# Patient Record
Sex: Female | Born: 1980 | Race: White | Hispanic: No | Marital: Married
Health system: Southern US, Community
[De-identification: ages and names within clinical notes are randomized; demographics above are authoritative.]

## PROBLEM LIST (undated history)

## (undated) DIAGNOSIS — O24419 Gestational diabetes mellitus in pregnancy, unspecified control: Secondary | ICD-10-CM

## (undated) DIAGNOSIS — E785 Hyperlipidemia, unspecified: Secondary | ICD-10-CM

## (undated) DIAGNOSIS — B009 Herpesviral infection, unspecified: Secondary | ICD-10-CM

## (undated) DIAGNOSIS — G43909 Migraine, unspecified, not intractable, without status migrainosus: Secondary | ICD-10-CM

## (undated) DIAGNOSIS — E079 Disorder of thyroid, unspecified: Secondary | ICD-10-CM

## (undated) HISTORY — PX: COLONOSCOPY: SHX174

## (undated) HISTORY — DX: Hyperlipidemia, unspecified: E78.5

## (undated) HISTORY — PX: TONSILLECTOMY AND ADENOIDECTOMY: SHX28

## (undated) HISTORY — PX: OTHER SURGICAL HISTORY: SHX169

## (undated) HISTORY — PX: WISDOM TOOTH EXTRACTION: SHX21

## (undated) HISTORY — DX: Migraine, unspecified, not intractable, without status migrainosus: G43.909

## (undated) HISTORY — PX: TURBINATE REDUCTION: SHX6157

## (undated) HISTORY — PX: BREAST BIOPSY: SHX20

## (undated) HISTORY — PX: TONSILLECTOMY: SUR1361

---
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
## 1998-07-15 ENCOUNTER — Other Ambulatory Visit: Admission: RE | Admit: 1998-07-15 | Discharge: 1998-07-15 | Payer: Self-pay | Admitting: Family Medicine

## 1998-07-20 ENCOUNTER — Encounter: Payer: Self-pay | Admitting: Internal Medicine

## 1998-07-20 ENCOUNTER — Ambulatory Visit (HOSPITAL_COMMUNITY): Admission: RE | Admit: 1998-07-20 | Discharge: 1998-07-20 | Payer: Self-pay | Admitting: Internal Medicine

## 1999-07-27 ENCOUNTER — Other Ambulatory Visit: Admission: RE | Admit: 1999-07-27 | Discharge: 1999-07-27 | Payer: Self-pay | Admitting: Internal Medicine

## 1999-08-09 ENCOUNTER — Encounter: Admission: RE | Admit: 1999-08-09 | Discharge: 1999-08-09 | Payer: Self-pay | Admitting: Internal Medicine

## 1999-08-09 ENCOUNTER — Encounter: Payer: Self-pay | Admitting: Internal Medicine

## 2000-04-23 ENCOUNTER — Encounter: Admission: RE | Admit: 2000-04-23 | Discharge: 2000-04-23 | Payer: Self-pay | Admitting: Internal Medicine

## 2000-04-23 ENCOUNTER — Encounter: Payer: Self-pay | Admitting: Internal Medicine

## 2000-08-14 ENCOUNTER — Other Ambulatory Visit: Admission: RE | Admit: 2000-08-14 | Discharge: 2000-08-14 | Payer: Self-pay | Admitting: Gynecology

## 2000-08-14 ENCOUNTER — Other Ambulatory Visit: Admission: RE | Admit: 2000-08-14 | Discharge: 2000-08-14 | Payer: Self-pay | Admitting: Obstetrics and Gynecology

## 2000-10-15 ENCOUNTER — Encounter: Payer: Self-pay | Admitting: Gastroenterology

## 2000-10-15 ENCOUNTER — Encounter: Admission: RE | Admit: 2000-10-15 | Discharge: 2000-10-15 | Payer: Self-pay | Admitting: Gastroenterology

## 2001-03-02 ENCOUNTER — Inpatient Hospital Stay (HOSPITAL_COMMUNITY): Admission: AD | Admit: 2001-03-02 | Discharge: 2001-03-05 | Payer: Self-pay | Admitting: Obstetrics and Gynecology

## 2001-04-15 ENCOUNTER — Encounter: Payer: Self-pay | Admitting: Family Medicine

## 2001-04-15 ENCOUNTER — Encounter: Admission: RE | Admit: 2001-04-15 | Discharge: 2001-04-15 | Payer: Self-pay | Admitting: Family Medicine

## 2001-09-16 ENCOUNTER — Other Ambulatory Visit: Admission: RE | Admit: 2001-09-16 | Discharge: 2001-09-16 | Payer: Self-pay | Admitting: Obstetrics and Gynecology

## 2002-07-28 ENCOUNTER — Encounter: Payer: Self-pay | Admitting: Family Medicine

## 2002-07-28 ENCOUNTER — Encounter: Admission: RE | Admit: 2002-07-28 | Discharge: 2002-07-28 | Payer: Self-pay | Admitting: Family Medicine

## 2002-11-17 ENCOUNTER — Other Ambulatory Visit: Admission: RE | Admit: 2002-11-17 | Discharge: 2002-11-17 | Payer: Self-pay | Admitting: Obstetrics and Gynecology

## 2003-11-24 ENCOUNTER — Other Ambulatory Visit: Admission: RE | Admit: 2003-11-24 | Discharge: 2003-11-24 | Payer: Self-pay | Admitting: Obstetrics and Gynecology

## 2003-11-30 ENCOUNTER — Encounter: Admission: RE | Admit: 2003-11-30 | Discharge: 2003-11-30 | Payer: Self-pay | Admitting: Obstetrics and Gynecology

## 2004-06-22 ENCOUNTER — Encounter: Admission: RE | Admit: 2004-06-22 | Discharge: 2004-06-22 | Payer: Self-pay | Admitting: Obstetrics and Gynecology

## 2004-07-24 ENCOUNTER — Ambulatory Visit (HOSPITAL_COMMUNITY): Admission: RE | Admit: 2004-07-24 | Discharge: 2004-07-24 | Payer: Self-pay | Admitting: General Surgery

## 2004-07-24 ENCOUNTER — Ambulatory Visit (HOSPITAL_BASED_OUTPATIENT_CLINIC_OR_DEPARTMENT_OTHER): Admission: RE | Admit: 2004-07-24 | Discharge: 2004-07-24 | Payer: Self-pay | Admitting: General Surgery

## 2004-11-28 ENCOUNTER — Other Ambulatory Visit: Admission: RE | Admit: 2004-11-28 | Discharge: 2004-11-28 | Payer: Self-pay | Admitting: Obstetrics and Gynecology

## 2005-08-23 ENCOUNTER — Emergency Department (HOSPITAL_COMMUNITY): Admission: EM | Admit: 2005-08-23 | Discharge: 2005-08-23 | Payer: Self-pay | Admitting: Emergency Medicine

## 2005-08-24 ENCOUNTER — Emergency Department (HOSPITAL_COMMUNITY): Admission: EM | Admit: 2005-08-24 | Discharge: 2005-08-24 | Payer: Self-pay | Admitting: Emergency Medicine

## 2010-01-28 ENCOUNTER — Encounter: Payer: Self-pay | Admitting: Obstetrics and Gynecology

## 2012-11-19 ENCOUNTER — Other Ambulatory Visit: Payer: Self-pay | Admitting: Obstetrics and Gynecology

## 2013-03-04 ENCOUNTER — Other Ambulatory Visit: Payer: Self-pay | Admitting: Otolaryngology

## 2014-01-20 ENCOUNTER — Other Ambulatory Visit: Payer: Self-pay | Admitting: Obstetrics and Gynecology

## 2014-01-21 LAB — CYTOLOGY - PAP

## 2014-12-28 ENCOUNTER — Other Ambulatory Visit: Payer: Self-pay | Admitting: Obstetrics and Gynecology

## 2015-01-18 LAB — OB RESULTS CONSOLE RUBELLA ANTIBODY, IGM: Rubella: NON-IMMUNE/NOT IMMUNE

## 2015-01-18 LAB — OB RESULTS CONSOLE HEPATITIS B SURFACE ANTIGEN: Hepatitis B Surface Ag: NEGATIVE

## 2015-01-18 LAB — OB RESULTS CONSOLE HIV ANTIBODY (ROUTINE TESTING): HIV: NONREACTIVE

## 2015-01-18 LAB — OB RESULTS CONSOLE RPR: RPR: NONREACTIVE

## 2015-01-18 LAB — OB RESULTS CONSOLE GC/CHLAMYDIA
Chlamydia: NEGATIVE
GC PROBE AMP, GENITAL: NEGATIVE

## 2015-01-18 LAB — OB RESULTS CONSOLE ABO/RH: RH Type: POSITIVE

## 2015-01-18 LAB — OB RESULTS CONSOLE GBS: STREP GROUP B AG: NEGATIVE

## 2015-01-18 LAB — OB RESULTS CONSOLE ANTIBODY SCREEN: Antibody Screen: NEGATIVE

## 2015-05-30 ENCOUNTER — Encounter: Payer: BLUE CROSS/BLUE SHIELD | Attending: Obstetrics and Gynecology | Admitting: Skilled Nursing Facility1

## 2015-05-30 VITALS — Ht 64.0 in | Wt 150.0 lb

## 2015-05-30 DIAGNOSIS — O99810 Abnormal glucose complicating pregnancy: Secondary | ICD-10-CM | POA: Insufficient documentation

## 2015-05-30 DIAGNOSIS — O24410 Gestational diabetes mellitus in pregnancy, diet controlled: Secondary | ICD-10-CM

## 2015-05-30 DIAGNOSIS — Z3A00 Weeks of gestation of pregnancy not specified: Secondary | ICD-10-CM | POA: Insufficient documentation

## 2015-06-01 ENCOUNTER — Encounter: Payer: Self-pay | Admitting: Skilled Nursing Facility1

## 2015-06-01 NOTE — Progress Notes (Signed)
  Patient was seen on 05/30/2015 for Gestational Diabetes self-management class at the [facility]. The following learning objectives were met by the patient during this course:   States the definition of Gestational Diabetes  States why dietary management is important in controlling blood glucose  Describes the effects each nutrient has on blood glucose levels  Demonstrates ability to create a balanced meal plan  Demonstrates carbohydrate counting   States when to check blood glucose levels  Demonstrates proper blood glucose monitoring techniques  States the effect of stress and exercise on blood glucose levels  States the importance of limiting caffeine and abstaining from alcohol and smoking  Blood glucose monitor given: One Touch Verio Flex Lot # [id number] x Exp: 03/2016 Blood glucose reading: 64  Patient instructed to monitor glucose levels: FBS: 60 - <90 1 hour: <140 2 hour: <120  *Patient received handouts:  Nutrition Diabetes and Pregnancy  Carbohydrate Counting List  Patient will be seen for follow-up as needed.

## 2015-07-15 ENCOUNTER — Other Ambulatory Visit: Payer: Self-pay | Admitting: Obstetrics

## 2015-07-18 LAB — OB RESULTS CONSOLE GBS: STREP GROUP B AG: NEGATIVE

## 2015-07-28 DIAGNOSIS — E785 Hyperlipidemia, unspecified: Secondary | ICD-10-CM | POA: Insufficient documentation

## 2015-07-28 DIAGNOSIS — K589 Irritable bowel syndrome without diarrhea: Secondary | ICD-10-CM | POA: Insufficient documentation

## 2015-08-02 ENCOUNTER — Other Ambulatory Visit: Payer: Self-pay | Admitting: Obstetrics and Gynecology

## 2015-08-03 ENCOUNTER — Encounter (HOSPITAL_COMMUNITY): Payer: Self-pay | Admitting: *Deleted

## 2015-08-05 NOTE — Patient Instructions (Signed)
[place]  08/05/2015   Your procedure is scheduled on:  08/09/2015  Enter through the Main Entrance of [facility] at [place] up the phone at the desk and dial [phone].   Call this number if you have problems the morning of surgery: [phone]   Remember:   Do not eat food:6 Hours before arrival. NPO after midnight do not eat  Do not drink clear liquids: After Midnight.  Take these medicines the morning of surgery with A SIP OF WATER: none   Do not wear jewelry, make-up or nail polish.  Do not wear lotions, powders, or perfumes. You may wear deodorant.  Do not shave 48 hours prior to surgery.  Do not bring valuables to the hospital.  [facility] is not   responsible for any belongings or valuables brought to the hospital.  Contacts, dentures or bridgework may not be worn into surgery.  Leave suitcase in the car. After surgery it may be brought to your room.  For patients admitted to the hospital, checkout time is 11:00 AM the day of              discharge.   Patients discharged the day of surgery will not be allowed to drive             home.  Name and phone number of your driver: na  Special Instructions:   N/A   Please read over the following fact sheets that you were given:   Surgical Site Infection Prevention

## 2015-08-08 ENCOUNTER — Encounter (HOSPITAL_COMMUNITY)
Admission: AD | Admit: 2015-08-08 | Discharge: 2015-08-08 | Disposition: A | Discharge status: Home / Self Care | Payer: BLUE CROSS/BLUE SHIELD | Source: Ambulatory Visit | Attending: Obstetrics and Gynecology | Admitting: Obstetrics and Gynecology

## 2015-08-08 DIAGNOSIS — O24419 Gestational diabetes mellitus in pregnancy, unspecified control: Secondary | ICD-10-CM | POA: Insufficient documentation

## 2015-08-08 DIAGNOSIS — O321XX0 Maternal care for breech presentation, not applicable or unspecified: Secondary | ICD-10-CM | POA: Insufficient documentation

## 2015-08-08 DIAGNOSIS — Z3A39 39 weeks gestation of pregnancy: Secondary | ICD-10-CM | POA: Insufficient documentation

## 2015-08-08 DIAGNOSIS — Z01812 Encounter for preprocedural laboratory examination: Secondary | ICD-10-CM | POA: Insufficient documentation

## 2015-08-08 HISTORY — DX: Gestational diabetes mellitus in pregnancy, unspecified control: O24.419

## 2015-08-08 HISTORY — DX: Herpesviral infection, unspecified: B00.9

## 2015-08-08 LAB — BASIC METABOLIC PANEL
Anion gap: 8 (ref 5–15)
BUN: 13 mg/dL (ref 6–20)
CALCIUM: 8.8 mg/dL — AB (ref 8.9–10.3)
CHLORIDE: 106 mmol/L (ref 101–111)
CO2: 21 mmol/L — AB (ref 22–32)
CREATININE: 0.53 mg/dL (ref 0.44–1.00)
GFR calc Af Amer: >60 mL/min (ref >60)
GFR calc non Af Amer: >60 mL/min (ref >60)
Glucose, Bld: 76 mg/dL (ref 65–99)
Potassium: 3.3 mmol/L — ABNORMAL LOW (ref 3.5–5.1)
Sodium: 135 mmol/L (ref 135–145)

## 2015-08-08 LAB — TYPE AND SCREEN
ABO/RH(D): A POS
ANTIBODY SCREEN: NEGATIVE

## 2015-08-08 LAB — CBC
HEMATOCRIT: 35 % — AB (ref 36.0–46.0)
HEMOGLOBIN: 12.3 g/dL (ref 12.0–15.0)
MCH: 31.1 pg (ref 26.0–34.0)
MCHC: 35.1 g/dL (ref 30.0–36.0)
MCV: 88.4 fL (ref 78.0–100.0)
Platelets: 155 10*3/uL (ref 150–400)
RBC: 3.96 MIL/uL (ref 3.87–5.11)
RDW: 13.2 % (ref 11.5–15.5)
WBC: 8.3 10*3/uL (ref 4.0–10.5)

## 2015-08-08 LAB — ABO/RH: ABO/RH(D): A POS

## 2015-08-09 ENCOUNTER — Inpatient Hospital Stay (HOSPITAL_COMMUNITY): Payer: BLUE CROSS/BLUE SHIELD | Admitting: Anesthesiology

## 2015-08-09 ENCOUNTER — Encounter (HOSPITAL_COMMUNITY): Payer: Self-pay | Admitting: Anesthesiology

## 2015-08-09 ENCOUNTER — Encounter (HOSPITAL_COMMUNITY)
Admission: AD | Disposition: A | Discharge status: Home / Self Care | Payer: Self-pay | Source: Ambulatory Visit | Attending: Obstetrics

## 2015-08-09 ENCOUNTER — Inpatient Hospital Stay (HOSPITAL_COMMUNITY)
Admission: AD | Admit: 2015-08-09 | Discharge: 2015-08-11 | DRG: 765 | Disposition: A | Discharge status: Home / Self Care | Payer: BLUE CROSS/BLUE SHIELD | Source: Ambulatory Visit | Attending: Obstetrics | Admitting: Obstetrics

## 2015-08-09 ENCOUNTER — Inpatient Hospital Stay (HOSPITAL_COMMUNITY)
Admission: AD | Admit: 2015-08-09 | Payer: BLUE CROSS/BLUE SHIELD | Source: Ambulatory Visit | Admitting: Obstetrics and Gynecology

## 2015-08-09 DIAGNOSIS — O9832 Other infections with a predominantly sexual mode of transmission complicating childbirth: Secondary | ICD-10-CM | POA: Diagnosis present

## 2015-08-09 DIAGNOSIS — O321XX0 Maternal care for breech presentation, not applicable or unspecified: Secondary | ICD-10-CM | POA: Diagnosis present

## 2015-08-09 DIAGNOSIS — Z3A39 39 weeks gestation of pregnancy: Secondary | ICD-10-CM

## 2015-08-09 DIAGNOSIS — A6000 Herpesviral infection of urogenital system, unspecified: Secondary | ICD-10-CM | POA: Diagnosis present

## 2015-08-09 DIAGNOSIS — O4202 Full-term premature rupture of membranes, onset of labor within 24 hours of rupture: Principal | ICD-10-CM | POA: Diagnosis present

## 2015-08-09 DIAGNOSIS — O24420 Gestational diabetes mellitus in childbirth, diet controlled: Secondary | ICD-10-CM | POA: Diagnosis present

## 2015-08-09 LAB — GLUCOSE, CAPILLARY
Glucose-Capillary: 85 mg/dL (ref 65–99)
Glucose-Capillary: 88 mg/dL (ref 65–99)

## 2015-08-09 LAB — CBC
HCT: 33.3 % — ABNORMAL LOW (ref 36.0–46.0)
Hemoglobin: 11.6 g/dL — ABNORMAL LOW (ref 12.0–15.0)
MCH: 30.5 pg (ref 26.0–34.0)
MCHC: 34.8 g/dL (ref 30.0–36.0)
MCV: 87.6 fL (ref 78.0–100.0)
PLATELETS: 146 10*3/uL — AB (ref 150–400)
RBC: 3.8 MIL/uL — AB (ref 3.87–5.11)
RDW: 13.3 % (ref 11.5–15.5)
WBC: 15.1 10*3/uL — AB (ref 4.0–10.5)

## 2015-08-09 LAB — RPR: RPR: NONREACTIVE

## 2015-08-09 SURGERY — Surgical Case
Anesthesia: Spinal

## 2015-08-09 MED ORDER — LACTATED RINGERS IV SOLN
INTRAVENOUS | Status: DC | PRN
  Administered 2015-08-09: 03:00:00 via INTRAVENOUS

## 2015-08-09 MED ORDER — LACTATED RINGERS IV SOLN
INTRAVENOUS | Status: DC
  Administered 2015-08-09: 02:00:00 via INTRAVENOUS
  Administered 2015-08-09: 125 mL/h via INTRAVENOUS

## 2015-08-09 MED ORDER — DIPHENHYDRAMINE HCL 25 MG PO CAPS: 25.0000 mg | ORAL_CAPSULE | ORAL | Status: DC | PRN

## 2015-08-09 MED ORDER — ACETAMINOPHEN 500 MG PO TABS
1000.0000 mg | ORAL_TABLET | Freq: Four times a day (QID) | ORAL | Status: AC
  Administered 2015-08-09: 1000 mg via ORAL
  Filled 2015-08-09 (×3): qty 2

## 2015-08-09 MED ORDER — MEPERIDINE HCL 25 MG/ML IJ SOLN
6.2500 mg | INTRAMUSCULAR | Status: DC | PRN
  Administered 2015-08-09: 6.25 mg via INTRAVENOUS

## 2015-08-09 MED ORDER — DIBUCAINE 1 % RE OINT: 1.0000 "application " | TOPICAL_OINTMENT | RECTAL | Status: DC | PRN

## 2015-08-09 MED ORDER — FAMOTIDINE IN NACL 20-0.9 MG/50ML-% IV SOLN
20.0000 mg | Freq: Once | INTRAVENOUS | Status: AC
  Administered 2015-08-09: 20 mg via INTRAVENOUS

## 2015-08-09 MED ORDER — IBUPROFEN 600 MG PO TABS
600.0000 mg | ORAL_TABLET | Freq: Four times a day (QID) | ORAL | Status: DC
  Administered 2015-08-10 – 2015-08-11 (×7): 600 mg via ORAL
  Filled 2015-08-09 (×9): qty 1

## 2015-08-09 MED ORDER — ONDANSETRON HCL 4 MG/2ML IJ SOLN
4.0000 mg | Freq: Three times a day (TID) | INTRAMUSCULAR | Status: DC | PRN
  Administered 2015-08-09: 4 mg via INTRAVENOUS
  Filled 2015-08-09: qty 2

## 2015-08-09 MED ORDER — OXYTOCIN 40 UNITS IN LACTATED RINGERS INFUSION - SIMPLE MED: 2.5000 [IU]/h | INTRAVENOUS | Status: AC

## 2015-08-09 MED ORDER — COCONUT OIL OIL: 1.0000 "application " | TOPICAL_OIL | Status: DC | PRN

## 2015-08-09 MED ORDER — FENTANYL CITRATE (PF) 100 MCG/2ML IJ SOLN
INTRAMUSCULAR | Status: AC
  Filled 2015-08-09: qty 2

## 2015-08-09 MED ORDER — SCOPOLAMINE 1 MG/3DAYS TD PT72
1.0000 | MEDICATED_PATCH | Freq: Once | TRANSDERMAL | Status: DC
  Administered 2015-08-09: 1.5 mg via TRANSDERMAL
  Filled 2015-08-09 (×2): qty 1

## 2015-08-09 MED ORDER — PHENYLEPHRINE 8 MG IN D5W 100 ML (0.08MG/ML) PREMIX OPTIME
INJECTION | INTRAVENOUS | Status: AC
  Filled 2015-08-09: qty 100

## 2015-08-09 MED ORDER — LACTATED RINGERS IV SOLN
INTRAVENOUS | Status: DC
  Administered 2015-08-09: 15:00:00 via INTRAVENOUS

## 2015-08-09 MED ORDER — FENTANYL CITRATE (PF) 100 MCG/2ML IJ SOLN
INTRAMUSCULAR | Status: DC | PRN
  Administered 2015-08-09: 20 ug via INTRAVENOUS

## 2015-08-09 MED ORDER — MEASLES, MUMPS & RUBELLA VAC ~~LOC~~ INJ
0.5000 mL | INJECTION | Freq: Once | SUBCUTANEOUS | Status: AC
  Administered 2015-08-11: 0.5 mL via SUBCUTANEOUS
  Filled 2015-08-09 (×2): qty 0.5

## 2015-08-09 MED ORDER — SOD CITRATE-CITRIC ACID 500-334 MG/5ML PO SOLN
30.0000 mL | Freq: Once | ORAL | Status: AC
  Administered 2015-08-09: 30 mL via ORAL

## 2015-08-09 MED ORDER — DEXAMETHASONE SODIUM PHOSPHATE 10 MG/ML IJ SOLN
INTRAMUSCULAR | Status: AC
Start: 2015-08-09 — End: 2015-08-09
  Filled 2015-08-09: qty 1

## 2015-08-09 MED ORDER — SIMETHICONE 80 MG PO CHEW
80.0000 mg | CHEWABLE_TABLET | ORAL | Status: DC
  Administered 2015-08-10 (×2): 80 mg via ORAL
  Filled 2015-08-09 (×2): qty 1

## 2015-08-09 MED ORDER — PHENYLEPHRINE 8 MG IN D5W 100 ML (0.08MG/ML) PREMIX OPTIME
INJECTION | INTRAVENOUS | Status: DC | PRN
  Administered 2015-08-09: 60 ug/min via INTRAVENOUS

## 2015-08-09 MED ORDER — FENTANYL CITRATE (PF) 100 MCG/2ML IJ SOLN
25.0000 ug | INTRAMUSCULAR | Status: DC | PRN
  Administered 2015-08-09: 50 ug via INTRAVENOUS

## 2015-08-09 MED ORDER — DEXAMETHASONE SODIUM PHOSPHATE 10 MG/ML IJ SOLN
INTRAMUSCULAR | Status: DC | PRN
  Administered 2015-08-09: 10 mg via INTRAVENOUS

## 2015-08-09 MED ORDER — BUPIVACAINE IN DEXTROSE 0.75-8.25 % IT SOLN
INTRATHECAL | Status: DC | PRN
  Administered 2015-08-09: 1.6 mL via INTRATHECAL

## 2015-08-09 MED ORDER — TETANUS-DIPHTH-ACELL PERTUSSIS 5-2.5-18.5 LF-MCG/0.5 IM SUSP
0.5000 mL | Freq: Once | INTRAMUSCULAR | Status: AC
  Administered 2015-08-11: 0.5 mL via INTRAMUSCULAR
  Filled 2015-08-09: qty 0.5

## 2015-08-09 MED ORDER — FAMOTIDINE IN NACL 20-0.9 MG/50ML-% IV SOLN
INTRAVENOUS | Status: AC
  Filled 2015-08-09: qty 50

## 2015-08-09 MED ORDER — SCOPOLAMINE 1 MG/3DAYS TD PT72
MEDICATED_PATCH | TRANSDERMAL | Status: AC
  Filled 2015-08-09: qty 1

## 2015-08-09 MED ORDER — NALOXONE HCL 0.4 MG/ML IJ SOLN: 0.4000 mg | INTRAMUSCULAR | Status: DC | PRN

## 2015-08-09 MED ORDER — MENTHOL 3 MG MT LOZG: 1.0000 | LOZENGE | OROMUCOSAL | Status: DC | PRN

## 2015-08-09 MED ORDER — SODIUM CHLORIDE 0.9% FLUSH: 3.0000 mL | INTRAVENOUS | Status: DC | PRN

## 2015-08-09 MED ORDER — MORPHINE SULFATE (PF) 0.5 MG/ML IJ SOLN
INTRAMUSCULAR | Status: DC | PRN
  Administered 2015-08-09: .2 mg via EPIDURAL

## 2015-08-09 MED ORDER — KETOROLAC TROMETHAMINE 30 MG/ML IJ SOLN
30.0000 mg | Freq: Four times a day (QID) | INTRAMUSCULAR | Status: AC | PRN
  Administered 2015-08-09: 30 mg via INTRAMUSCULAR
  Filled 2015-08-09: qty 1

## 2015-08-09 MED ORDER — SIMETHICONE 80 MG PO CHEW: 80.0000 mg | CHEWABLE_TABLET | ORAL | Status: DC | PRN

## 2015-08-09 MED ORDER — PROMETHAZINE HCL 25 MG/ML IJ SOLN: 6.2500 mg | INTRAMUSCULAR | Status: DC | PRN

## 2015-08-09 MED ORDER — SOD CITRATE-CITRIC ACID 500-334 MG/5ML PO SOLN
ORAL | Status: AC
  Filled 2015-08-09: qty 15

## 2015-08-09 MED ORDER — SENNOSIDES-DOCUSATE SODIUM 8.6-50 MG PO TABS
2.0000 | ORAL_TABLET | ORAL | Status: DC
  Administered 2015-08-10 (×2): 2 via ORAL
  Filled 2015-08-09 (×2): qty 2

## 2015-08-09 MED ORDER — OXYTOCIN 10 UNIT/ML IJ SOLN
INTRAVENOUS | Status: DC | PRN
  Administered 2015-08-09: 40 [IU] via INTRAVENOUS

## 2015-08-09 MED ORDER — MORPHINE SULFATE-NACL 0.5-0.9 MG/ML-% IV SOSY
PREFILLED_SYRINGE | INTRAVENOUS | Status: AC
  Filled 2015-08-09: qty 1

## 2015-08-09 MED ORDER — KETOROLAC TROMETHAMINE 30 MG/ML IJ SOLN: 30.0000 mg | Freq: Four times a day (QID) | INTRAMUSCULAR | Status: AC | PRN

## 2015-08-09 MED ORDER — MEPERIDINE HCL 25 MG/ML IJ SOLN
INTRAMUSCULAR | Status: AC
  Filled 2015-08-09: qty 1

## 2015-08-09 MED ORDER — ACETAMINOPHEN 325 MG PO TABS
650.0000 mg | ORAL_TABLET | ORAL | Status: DC | PRN
  Administered 2015-08-10 (×2): 650 mg via ORAL
  Filled 2015-08-09 (×2): qty 2

## 2015-08-09 MED ORDER — ONDANSETRON HCL 4 MG/2ML IJ SOLN
INTRAMUSCULAR | Status: DC | PRN
  Administered 2015-08-09: 4 mg via INTRAVENOUS

## 2015-08-09 MED ORDER — WITCH HAZEL-GLYCERIN EX PADS: 1.0000 "application " | MEDICATED_PAD | CUTANEOUS | Status: DC | PRN

## 2015-08-09 MED ORDER — CEFAZOLIN SODIUM-DEXTROSE 2-4 GM/100ML-% IV SOLN
2.0000 g | INTRAVENOUS | Status: AC
  Administered 2015-08-09: 2 g via INTRAVENOUS

## 2015-08-09 MED ORDER — SIMETHICONE 80 MG PO CHEW
80.0000 mg | CHEWABLE_TABLET | Freq: Three times a day (TID) | ORAL | Status: DC
  Administered 2015-08-09 – 2015-08-11 (×5): 80 mg via ORAL
  Filled 2015-08-09 (×7): qty 1

## 2015-08-09 MED ORDER — NALOXONE HCL 2 MG/2ML IJ SOSY
1.0000 ug/kg/h | PREFILLED_SYRINGE | INTRAVENOUS | Status: DC | PRN
  Filled 2015-08-09: qty 2

## 2015-08-09 MED ORDER — ONDANSETRON HCL 4 MG/2ML IJ SOLN
INTRAMUSCULAR | Status: AC
  Filled 2015-08-09: qty 2

## 2015-08-09 MED ORDER — DIPHENHYDRAMINE HCL 25 MG PO CAPS: 25.0000 mg | ORAL_CAPSULE | Freq: Four times a day (QID) | ORAL | Status: DC | PRN

## 2015-08-09 MED ORDER — SODIUM CHLORIDE 0.9 % IR SOLN
Status: DC | PRN
  Administered 2015-08-09: 1000 mL

## 2015-08-09 MED ORDER — OXYTOCIN 10 UNIT/ML IJ SOLN
INTRAMUSCULAR | Status: AC
  Filled 2015-08-09: qty 4

## 2015-08-09 MED ORDER — OXYCODONE HCL 5 MG PO TABS
5.0000 mg | ORAL_TABLET | ORAL | Status: DC | PRN
  Administered 2015-08-10: 5 mg via ORAL
  Filled 2015-08-09: qty 1

## 2015-08-09 MED ORDER — DIPHENHYDRAMINE HCL 50 MG/ML IJ SOLN: 12.5000 mg | INTRAMUSCULAR | Status: DC | PRN

## 2015-08-09 MED ORDER — PRENATAL MULTIVITAMIN CH
1.0000 | ORAL_TABLET | Freq: Every day | ORAL | Status: DC
  Administered 2015-08-10 – 2015-08-11 (×2): 1 via ORAL
  Filled 2015-08-09 (×3): qty 1

## 2015-08-09 MED ORDER — OXYCODONE HCL 5 MG PO TABS
10.0000 mg | ORAL_TABLET | ORAL | Status: DC | PRN
  Administered 2015-08-11 (×2): 10 mg via ORAL
  Filled 2015-08-09 (×2): qty 2

## 2015-08-09 MED ORDER — SCOPOLAMINE 1 MG/3DAYS TD PT72
MEDICATED_PATCH | TRANSDERMAL | Status: DC | PRN
  Administered 2015-08-09: 1 via TRANSDERMAL

## 2015-08-09 SURGICAL SUPPLY — 29 items
APL SKNCLS STERI-STRIP NONHPOA (GAUZE/BANDAGES/DRESSINGS) ×1
BENZOIN TINCTURE PRP APPL 2/3 (GAUZE/BANDAGES/DRESSINGS) ×2 IMPLANT
CHLORAPREP W/TINT 26ML (MISCELLANEOUS) ×3 IMPLANT
CLAMP CORD UMBIL (MISCELLANEOUS) IMPLANT
CLOSURE WOUND 1/2 X4 (GAUZE/BANDAGES/DRESSINGS) ×1
CLOTH BEACON ORANGE TIMEOUT ST (SAFETY) ×3 IMPLANT
CONTAINER PREFILL 10% NBF 15ML (MISCELLANEOUS) IMPLANT
DRSG OPSITE POSTOP 4X10 (GAUZE/BANDAGES/DRESSINGS) ×3 IMPLANT
ELECT REM PT RETURN 9FT ADLT (ELECTROSURGICAL) ×3
ELECTRODE REM PT RTRN 9FT ADLT (ELECTROSURGICAL) ×1 IMPLANT
EXTRACTOR VACUUM M CUP 4 TUBE (SUCTIONS) IMPLANT
EXTRACTOR VACUUM M CUP 4' TUBE (SUCTIONS)
GLOVE BIOGEL PI IND STRL 7.0 (GLOVE) ×1 IMPLANT
GLOVE BIOGEL PI INDICATOR 7.0 (GLOVE) ×2
GLOVE ECLIPSE 7.0 STRL STRAW (GLOVE) ×8 IMPLANT
GOWN STRL REUS W/TWL LRG LVL3 (GOWN DISPOSABLE) ×8 IMPLANT
KIT ABG SYR 3ML LUER SLIP (SYRINGE) IMPLANT
NDL HYPO 25X5/8 SAFETYGLIDE (NEEDLE) IMPLANT
NEEDLE HYPO 25X5/8 SAFETYGLIDE (NEEDLE) IMPLANT
NS IRRIG 1000ML POUR BTL (IV SOLUTION) ×3 IMPLANT
PACK C SECTION WH (CUSTOM PROCEDURE TRAY) ×3 IMPLANT
PAD OB MATERNITY 4.3X12.25 (PERSONAL CARE ITEMS) ×3 IMPLANT
STRIP CLOSURE SKIN 1/2X4 (GAUZE/BANDAGES/DRESSINGS) ×1 IMPLANT
SUT MNCRL 0 VIOLET CTX 36 (SUTURE) ×3 IMPLANT
SUT MON AB 2-0 CT1 27 (SUTURE) ×6 IMPLANT
SUT MONOCRYL 0 CTX 36 (SUTURE) ×6
SUT PLAIN 0 NONE (SUTURE) IMPLANT
TOWEL OR 17X24 6PK STRL BLUE (TOWEL DISPOSABLE) ×3 IMPLANT
TRAY FOLEY CATH SILVER 14FR (SET/KITS/TRAYS/PACK) IMPLANT

## 2015-08-09 NOTE — Op Note (Signed)
Cesarean Section Procedure Note  Pre-operative Diagnosis: 1. Intrauterine pregnancy at [redacted]w[redacted]d  2. Breech presentation  3. Rupture of membranes  Post-operative Diagnosis: same as above  Surgeon: Jerelyn Charles, MD  Procedure: Primary low transverse cesarean section   Anesthesia: Spinal anesthesia  Estimated Blood Loss: 500 mL         Drains: Foley catheter         Specimens: none         Complications:  None; patient tolerated the procedure well.         Disposition: PACU - hemodynamically stable.  Findings:  Normal uterus, tubes and ovaries bilaterally.  Viable female infant, weight pending, Apgars 8, 9.    Procedure Details   After spingalanesthesia was found to adequate , the patient was placed in the dorsal supine position with a leftward tilt, draped and prepped in the usual sterile manner. A Pfannenstiel incision was made and carried down through the subcutaneous tissue to the fascia. The fascia was incised in the midline and the fascial incision was extended laterally with Mayo scissors. The superior aspect of the fascial incision was grasped with two Kocher clamp, tented up and the rectus muscles dissected off sharply. The rectus was then dissected off with blunt dissection and Mayo scissors inferiorly. The rectus muscles were separated in the midline. The abdominal peritoneum was identified, tented up, entered bluntly, and the incision was extended superiorly and inferiorly with good visualization of the bladder. The Alexis retractor was deployed. The vesicouterine peritoneum was identified, tented up, entered sharply, and the bladder flap was created digitally. Scalpel was then used to make a low transverse incision on the uterus which was extended in the cephalad-caudad direction with blunt dissection. The fluid was clear. The fetal breech was identified, elevated out of the pelvis and brought to the hysterotomy. The fetus was delivered from the frank breech position with the usual  breech maneuvers. After one minute, the cord was clamped and cut and the infant was passed to the waiting neonatologist. Placenta was then delivered spontaneously, intact and appear normal, the uterus was cleared of all clot and debris   The hysterotomy was repaired with #0 Monocryl in running locked fashion.  A second imbricating layer was placed with #0 Monocryl in a running fashion.   The serosal edges of the incision were oozy, and bovie cautery was used to achieve hemostasis.  The hysterotomy was reexamined and excellent hemostasis was noted.  The Alexis retractor was removed from the abdomen. The peritoneum was examined and all vessels noted to be hemostatic. The abdominal cavity was cleared of all clot and debris.  The peritoneum was closed with 2-0 vicryl in a running fashion. The fascia and rectus muscles were inspected and were hemostatic. The fascia was closed with 0 Vicryl in a running fashion. The subcuticular layer was irrigated and all bleeders cauterized.  The subcutaneous layer was re approximated with interrupted 3-0 plain gut.  The skin was closed with 3-0 monocryl in a subcuticular fashion. The incision was dressed with benzoine, steri strips and pressure dressing. All sponge lap and needle counts were correct x3. Patient tolerated the procedure well and recovered in stable condition following the procedure.

## 2015-08-09 NOTE — MAU Note (Signed)
Pt presents complaining of SROM at 0030 with copious amounts of clear fluid. Scheduled from primary c/s this am for breech presentation. Breech presentation confirmed by CNM with bedside u/s. Denies bleeding. Reports good fetal movement. No pain

## 2015-08-09 NOTE — Anesthesia Postprocedure Evaluation (Signed)
Anesthesia Post Note  Patient: Marwa Hisaw  Procedure(s) Performed: Procedure(s) (LRB): CESAREAN SECTION (N/A)  Patient location during evaluation: PACU Anesthesia Type: Spinal Level of consciousness: oriented and awake and alert Pain management: pain level controlled Vital Signs Assessment: post-procedure vital signs reviewed and stable Respiratory status: spontaneous breathing, respiratory function stable and patient connected to nasal cannula oxygen Cardiovascular status: blood pressure returned to baseline and stable Postop Assessment: no headache, no backache and spinal receding Anesthetic complications: no     Last Vitals:  Vitals:   08/09/15 0430 08/09/15 0443  BP: 127/79 125/90  Pulse: 75 78  Resp: 17 16  Temp:  36.5 C    Last Pain:  Vitals:   08/09/15 0430  TempSrc:   PainSc: 3    Pain Goal:                 Demaris Leavell J

## 2015-08-09 NOTE — Anesthesia Postprocedure Evaluation (Signed)
Anesthesia Post Note  Patient: Hannah Best  Procedure(s) Performed: Procedure(s) (LRB): CESAREAN SECTION (N/A)  Patient location during evaluation: Mother Baby Anesthesia Type: Spinal Level of consciousness: awake, awake and alert and oriented Pain management: pain level controlled Vital Signs Assessment: post-procedure vital signs reviewed and stable Respiratory status: spontaneous breathing, nonlabored ventilation and respiratory function stable Cardiovascular status: stable Postop Assessment: no headache, no backache and patient able to bend at knees (Patient has nausea and vomited during the night and one hour ago. Urine  is not concentrated. Patient does not want  any more antiemetics or narcotics. Told her since she has nausea with narcotics to take motrin and tylenol regularly to control pain. ) Anesthetic complications: no     Last Vitals:  Vitals:   08/09/15 0550 08/09/15 0656  BP: 106/67 114/69  Pulse: 77 61  Resp: 16 18  Temp: 36.6 C 36.7 C    Last Pain:  Vitals:   08/09/15 0715  TempSrc:   PainSc: 1    Pain Goal:                 Diania Co

## 2015-08-09 NOTE — Progress Notes (Signed)
Subjective: Postpartum Day 0: Cesarean Delivery Patient reports nausea and vomiting.  Believes pain medication causing vomiting  Objective: Vital signs in last 24 hours: Temp:  [97.4 F (36.3 C)-98.3 F (36.8 C)] 97.7 F (36.5 C) (08/01 0815) Pulse Rate:  [61-98] 63 (08/01 0815) Resp:  [11-22] 20 (08/01 0815) BP: (106-127)/(51-92) 113/75 (08/01 0815) SpO2:  [96 %-100 %] 98 % (08/01 0656)  Physical Exam:  General: alert, cooperative and appears stated age Lochia: appropriate Uterine Fundus: firm Incision: healing well DVT Evaluation: No evidence of DVT seen on physical exam.   Recent Labs  08/08/15 1034 08/09/15 0603  HGB 12.3 11.6*  HCT 35.0* 33.3*    Assessment/Plan: Status post Cesarean section. Doing well postoperatively.  Continue current care.  Jennfier Abdulla H. 08/09/2015, 9:57 AM

## 2015-08-09 NOTE — Brief Op Note (Signed)
08/09/2015  3:13 AM  PATIENT:  Gaspar Bidding  35 y.o. female  PRE-OPERATIVE DIAGNOSIS:  primary cesarean section for breech presentation & rupture of membranes  POST-OPERATIVE DIAGNOSIS:  primary cesarean section for breech presentation & rupture of membranes  PROCEDURE:  Procedure(s): CESAREAN SECTION (N/A)  SURGEON:  Surgeon(s) and Role:    * Jerelyn Charles, MD - Primary  ANESTHESIA:   spinal  EBL:  Total I/O In: 2000 [I.V.:2000] Out: 600 [Urine:100; Blood:500]  BLOOD ADMINISTERED:none  DRAINS: none   LOCAL MEDICATIONS USED:  NONE  SPECIMEN:  No Specimen  DISPOSITION OF SPECIMEN:  N/A  COUNTS:  YES  TOURNIQUET:  * No tourniquets in log *  DICTATION: .Note written in EPIC  PLAN OF CARE: Admit to inpatient   PATIENT DISPOSITION:  PACU - hemodynamically stable.   Delay start of Pharmacological VTE agent (>24hrs) due to surgical blood loss or risk of bleeding: yes

## 2015-08-09 NOTE — Anesthesia Preprocedure Evaluation (Addendum)
Anesthesia Evaluation  Patient identified by MRN, date of birth, ID band Patient awake and Patient unresponsive    Reviewed: Allergy & Precautions, NPO status , Patient's Chart, lab work & pertinent test results  Airway Mallampati: II  TM Distance: >3 FB Neck ROM: Full    Dental no notable dental hx.    Pulmonary neg pulmonary ROS,    Pulmonary exam normal breath sounds clear to auscultation       Cardiovascular negative cardio ROS Normal cardiovascular exam Rhythm:Regular Rate:Normal     Neuro/Psych negative neurological ROS  negative psych ROS   GI/Hepatic negative GI ROS, Neg liver ROS,   Endo/Other  negative endocrine ROSdiabetes  Renal/GU negative Renal ROS  negative genitourinary   Musculoskeletal negative musculoskeletal ROS (+)   Abdominal   Peds negative pediatric ROS (+)  Hematology negative hematology ROS (+)   Anesthesia Other Findings   Reproductive/Obstetrics negative OB ROS                             Anesthesia Physical Anesthesia Plan  ASA: II  Anesthesia Plan: Spinal   Post-op Pain Management:    Induction: Intravenous  Airway Management Planned: Natural Airway  Additional Equipment:   Intra-op Plan:   Post-operative Plan:   Informed Consent: I have reviewed the patients History and Physical, chart, labs and discussed the procedure including the risks, benefits and alternatives for the proposed anesthesia with the patient or authorized representative who has indicated his/her understanding and acceptance.   Dental advisory given  Plan Discussed with: CRNA  Anesthesia Plan Comments: (Breech presentation and ROM.  Discussed risks and benefits of and differences between spinal and general. Discussed risks of spinal including headache, backache, failure, bleeding and hematoma, infection, and nerve damage. Patient consents to spinal. Questions answered.  Platelet count acceptable.)       Anesthesia Quick Evaluation

## 2015-08-09 NOTE — Lactation Note (Signed)
This note was copied from a baby's chart. Lactation Consultation Note  Patient Name: Hannah Best [id number] Date: 08/09/2015 Reason for consult: Follow-up assessment  Baby is 90 hours old . 2nd visit for LC this afternoon .  Baby latched prior to [facility] coming in the room.  Baby latched with depth , multiply swallows, increased with breast compressions.  LC reviewed how important breast compressions are to keep the baby  in a consistent pattern. Per mom comfortable with latch.  Mother informed of post-discharge support and given phone number to the lactation department, including  services for phone call assistance; out-patient appointments; and breastfeeding support group. List of other  breastfeeding resources in the community given in the handout. Encouraged mother to call for problems or  concerns related to breastfeeding.   Maternal Data Does the patient have breastfeeding experience prior to this delivery?: Yes  Feeding Feeding Type:  (baby already latched / swallows noted ) Length of feed:  (LC observed the baby alaredy latched w/ depth , swallows )  LATCH Score/Interventions Latch:  (latched with depth )  Audible Swallowing:  (multiply swallows noted )     Comfort (Breast/Nipple):  (mom comfortable with latch )     Hold (Positioning):  (mom independent with latch per MBU RN ) Intervention(s): Breastfeeding basics reviewed;Support Pillows;Skin to skin     Lactation Tools Discussed/Used     Consult Status Consult Status: Follow-up Date: 08/10/15 Follow-up type: In-patient    [name] 08/09/2015, 2:27 PM

## 2015-08-09 NOTE — Transfer of Care (Signed)
Immediate Anesthesia Transfer of Care Note  Patient: Hannah Best  Procedure(s) Performed: Procedure(s): CESAREAN SECTION (N/A)  Patient Location: PACU  Anesthesia Type:Spinal  Level of Consciousness: awake, alert , oriented and patient cooperative  Airway & Oxygen Therapy: Patient Spontanous Breathing  Post-op Assessment: Report given to RN and Post -op Vital signs reviewed and stable  Post vital signs: Reviewed and stable  Last Vitals:  Vitals:   08/09/15 0048  BP: 126/92  Pulse: 98  Resp: 18  Temp: 36.8 C    Last Pain:  Vitals:   08/09/15 0048  TempSrc: Oral         Complications: No apparent anesthesia complications

## 2015-08-09 NOTE — Anesthesia Procedure Notes (Signed)
Spinal  Patient location during procedure: OB Staffing Anesthesiologist: Franne Grip Preanesthetic Checklist Completed: patient identified, site marked, surgical consent, pre-op evaluation, timeout performed, IV checked, risks and benefits discussed and monitors and equipment checked Spinal Block Patient position: sitting Prep: DuraPrep Patient monitoring: blood pressure, continuous pulse ox, cardiac monitor and heart rate Approach: midline Location: L3-4 Injection technique: single-shot Needle Needle type: Pencan  Needle gauge: 24 G Needle length: 10 cm Additional Notes Non-latex gloves used. No paresthesia. Tolerated well.

## 2015-08-09 NOTE — H&P (Signed)
35 y.o. G2P1001 @ [redacted]w[redacted]d presents with ROM at [place].  She reports clear fluid.  Otherwise has good fetal movement and no bleeding.  Pregnancy c/b: 1. Breech presentation: confirmed by BSUS on exam this AM.  Declined ECV 2. GDMA1 3. AMA: NIPT low risk  Past Medical History:  Diagnosis Date  . Gestational diabetes    diet controlled  . Herpes   . Hyperlipidemia     Past Surgical History:  Procedure Laterality Date  . BREAST BIOPSY    . septorhinoplasty    . TONSILLECTOMY AND ADENOIDECTOMY    . TURBINATE REDUCTION      OB History  Gravida Para Term Preterm AB Living  2 1 1     1   SAB TAB Ectopic Multiple Live Births          1    # Outcome Date GA Lbr Len/2nd Weight Sex Delivery Anes PTL Lv  2 Current           1 Term 2003   3.118 kg (6 lb 14 oz) M Vag-Spont EPI  LIV      Social History   Social History  . Marital status: Married    Spouse name: N/A  . Number of children: N/A  . Years of education: N/A   Occupational History  . Not on file.   Social History Main Topics  . Smoking status: Never Smoker  . Smokeless tobacco: Never Used  . Alcohol use No  . Drug use: No  . Sexual activity: Not on file   Other Topics Concern  . Not on file   Social History Narrative  . No narrative on file   Latex; Shea butter; Ciprofloxacin; and Hydrocodone    Prenatal Transfer Tool  Maternal Diabetes: Yes:  Diabetes Type:  Diet controlled Genetic Screening: Normal Maternal Ultrasounds/Referrals: Normal Fetal Ultrasounds or other Referrals:  None Maternal Substance Abuse:  No Significant Maternal Medications:  Meds include: Other:  valtrex Significant Maternal Lab Results: Lab values include: Group B Strep negative  ABO, Rh: --/--/A POS, A POS (07/31 1034) Antibody: NEG (07/31 1034) Rubella:Non-immune RPR: Nonreactive (01/10 0000)  HBsAg: Negative (01/10 0000)  HIV: Non-reactive (01/10 0000)  GBS: Negative (07/10 0000)      Vitals:   08/09/15 0048  BP: 126/92   Pulse: 98  Resp: 18  Temp: 98.3 F (36.8 C)     General:  NAD Abdomen:  soft, gravid Ex:  no edema SVE:  2/70 per RN FHTs:  130s, mod var, + accels, no decels Toco:  q4-6 min   A/P   35 y.o. G2P1001 [redacted]w[redacted]d presents with PROM, breech presentation Breech presentation: desires cesarean section.  Discussed risks to include infectino, bleeding, damage to surrounding structures (including but not limited to bowel, bladder, tubes, ovaries, nerves, vessels, baby), risk of blood clot, blood transfusion, need for additional procedures.  Consent signed    [facility] GEFFEL Carlis Abbott

## 2015-08-09 NOTE — Lactation Note (Signed)
This note was copied from a baby's chart. Lactation Consultation Note  Patient Name: Hannah Best [id number] Date: 08/09/2015 Reason for consult: Initial assessment (encouraged mom to page [facility] with feedingt cues )  Baby is 79 hours old and has been to the breast several times since birth , voided and stooled.  This mom is an experienced breast feeding mom of 14 months.  LC encouraged mom to call with feeding cues. Mother informed of post-discharge support and given phone number to the lactation department, including services  for phone call assistance; out-patient appointments; and breastfeeding support group. List of other breastfeeding resources  in the community given in the handout. Encouraged mother to call for problems or concerns related to breastfeeding.    Maternal Data Does the patient have breastfeeding experience prior to this delivery?: Yes  Feeding Feeding Type:  (per mom last fed at 1000 15 mins each side, presently not showing signs of hunger ) Length of feed: 15 min  LATCH Score/Interventions Latch: Grasps breast easily, tongue down, lips flanged, rhythmical sucking.  Audible Swallowing: Spontaneous and intermittent  Type of Nipple: Everted at rest and after stimulation  Comfort (Breast/Nipple): Soft / non-tender     Hold (Positioning): No assistance needed to correctly position infant at breast. Intervention(s): Breastfeeding basics reviewed  LATCH Score: 10  Lactation Tools Discussed/Used     Consult Status Consult Status: Follow-up Date: 08/09/15 Follow-up type: In-patient    [name] 08/09/2015, 1:43 PM

## 2015-08-10 NOTE — Progress Notes (Signed)
POD#1 Pt doing well. Lochia well. VSSAF IMP/ POD#1 doing well. Plan/ routine care

## 2015-08-10 NOTE — Lactation Note (Signed)
This note was copied from a baby's chart. Lactation Consultation Note  Patient Name: Hannah Best [id number] Date: 08/10/2015 Reason for consult: Follow-up assessment Baby at 39 hr of life. Mom is reporting bilateral bleeding nipples and stated the baby has not had a wet diaper today. She does have a small dark red area on the nipple surface bilaterally. She has been using coconut oil from home. Given comfort gels and shells. Baby latches easily with wide open mouth and flanged lips. An oral assessment was not done at this visit but told mom is the nipples do not get better she should request lactation to come back. Baby has had 1 wet and 6 stool diapers in the last 24 hr. Reviewed how to tell if the diaper was wet. Discussed baby behavior, feeding frequency, voids, wt loss, breast changes, and nipple care. Mom stated she can manually express and has spoon in room. She is aware of lactation services and support group. She will call as needed.      Maternal Data    Feeding Feeding Type: Breast Fed Length of feed: 30 min  LATCH Score/Interventions Latch: Grasps breast easily, tongue down, lips flanged, rhythmical sucking. Intervention(s): Assist with latch  Audible Swallowing: A few with stimulation Intervention(s): Hand expression  Type of Nipple: Everted at rest and after stimulation  Comfort (Breast/Nipple): Filling, red/small blisters or bruises, mild/mod discomfort  Problem noted: Cracked, bleeding, blisters, bruises;Severe discomfort Interventions  (Cracked/bleeding/bruising/blister): Expressed breast milk to nipple;Other (comment)  Hold (Positioning): No assistance needed to correctly position infant at breast. Intervention(s): Position options  LATCH Score: 8  Lactation Tools Discussed/Used WIC Program: No   Consult Status Consult Status: Follow-up Date: 08/11/15 Follow-up type: In-patient    Denzil Hughes 08/10/2015, 6:17 PM

## 2015-08-11 MED ORDER — OXYCODONE-ACETAMINOPHEN 7.5-325 MG PO TABS
1.0000 | ORAL_TABLET | ORAL | 0 refills | Status: DC | PRN
Start: 2015-08-11 — End: 2017-11-13

## 2015-08-11 MED ORDER — IBUPROFEN 600 MG PO TABS: 600.0000 mg | ORAL_TABLET | Freq: Four times a day (QID) | ORAL | 3 refills | Status: DC | PRN

## 2015-08-11 NOTE — Lactation Note (Signed)
This note was copied from a baby's chart. Lactation Consultation Note  Patient Name: Hannah Best [id number] Date: 08/11/2015 Reason for consult: Follow-up assessment;Other (Comment);Infant weight loss (7% weight loss , Dyad for D/C today - early )  Per mom  The baby recently breast fed ( see note below) . Presently mom holding abby and baby sleeping.  When mom laid baby down, while LC present abby got gaggy and LC assisted mom with burping and using the bulb syringe. Small amount of  Clear spit. Color pink. MBU RN Hannah Best aware. Reassured mom it's normal, especially with a C/section baby.  LC reviewed ways to burp the baby also recommended before the baby fed to burp the baby , bonus if the baby burped, and mom would probably notice the  Baby would pass a lot of gas and it would keep the feeding down. And after the feeding. Offer 2nd breast.  LC also recommended to mom to hold off on a pacifier until after 3 rd week growth spurt, if the baby is still hungry offer breast again.  Mom has been sore and per mom the breast shells and comfort gels are really helping.  LC recommended continuing to use the comfort gels after she feeds both nipples ( can place in refig. )m when warm switch and use shells,  If the soreness doesn't clear up by 4 days form D/C to call [facility] office for [facility] O/P appt. Sore nipple and engorgement prevention and tx reviewed.  Hannah Best om will plan to use a hand pump at home due to what she prefers.  LC updated doc flow sheets. 5 wet diapers in baby's life. Per mom changed 5-6 diapers since 12 MN , can't remember times and think some were wet.  LC recommended when she goes home to fold a tissue and place it in the diaper and when changing if the tissue is saturated and yellow it's a wet and esp  With a stool diaper.  LC also reviewed breast feeding information from the Baby and me booklet and recommended using it has a resource.  Mother informed of post-discharge support and  given phone number to the lactation department, including services for phone call assistance; out-patient appointments; and breastfeeding support group. List of other breastfeeding resources in the community given in the handout. Encouraged mother to call for problems or concerns related to breastfeeding.   Maternal Data Has patient been taught Hand Expression?:  (per mom feels comfortable with technique )  Feeding Feeding Type:  (per mom last fed at 1245 for 15 mins ) Length of feed: 15 min (per mom ,reports increased swallows )  LATCH Score/Interventions                Intervention(s): Breastfeeding basics reviewed     Lactation Tools Discussed/Used Tools: Shells;Comfort gels (per mom both are helping ) Shell Type: Inverted Pump Review: Milk Storage   Consult Status Consult Status: Complete Date: 08/11/15    Hannah Best 08/11/2015, 2:01 PM

## 2015-08-11 NOTE — Discharge Summary (Signed)
Obstetric Discharge Summary Reason for Admission: rupture of membranes and breech presentation Prenatal Procedures: NST and ultrasound Intrapartum Procedures: cesarean: low cervical, transverse Postpartum Procedures: none Complications-Operative and Postpartum: none Hemoglobin  Date Value Ref Range Status  08/09/2015 11.6 (L) 12.0 - 15.0 g/dL Final   HCT  Date Value Ref Range Status  08/09/2015 33.3 (L) 36.0 - 46.0 % Final    Physical Exam:  General: alert, cooperative and no distress Lochia: appropriate Uterine Fundus: firm Incision: healing well, no significant drainage, no dehiscence, no significant erythema DVT Evaluation: No evidence of DVT seen on physical exam. Negative Homan's sign. No cords or calf tenderness.  Discharge Diagnoses: Term Pregnancy-delivered  Discharge Information: Date: 08/11/2015 Activity: pelvic rest Diet: routine Medications: Ibuprofen and Percocet Condition: stable Instructions: refer to practice specific booklet Discharge to: home   Newborn Data: Live born female  Birth Weight: 6 lb 11.2 oz (3040 g) APGAR: 8, 9  Home with mother.  [place], [place] 08/11/2015, 8:43 AM

## 2015-08-11 NOTE — Progress Notes (Signed)
Subjective: Postpartum Day 2: Cesarean Delivery Patient reports incisional pain burning right side, tolerating PO, + flatus, + BM and no problems voiding.    Objective: Vital signs in last 24 hours: Temp:  [97.9 F (36.6 C)-98.2 F (36.8 C)] 98.2 F (36.8 C) (08/03 0545) Pulse Rate:  [65-76] 65 (08/03 0545) Resp:  [18-22] 18 (08/03 0545) BP: (104-116)/(64-69) 116/67 (08/03 0545)  Physical Exam:  General: alert, cooperative and no distress Lochia: appropriate Uterine Fundus: firm Incision: healing well, no significant drainage, no dehiscence, no significant erythema DVT Evaluation: No evidence of DVT seen on physical exam. Negative Homan's sign. No cords or calf tenderness.   Recent Labs  08/08/15 1034 08/09/15 0603  HGB 12.3 11.6*  HCT 35.0* 33.3*    Assessment/Plan: Status post Cesarean section. Doing well postoperatively.  Patient wants to go home today, will d/c with f/u in office for routine post partum visit.  [place], [place] 08/11/2015, 8:37 AM

## 2016-12-10 ENCOUNTER — Ambulatory Visit: Payer: Self-pay | Admitting: Surgery

## 2017-11-13 ENCOUNTER — Ambulatory Visit: Payer: BLUE CROSS/BLUE SHIELD | Admitting: Neurology

## 2017-11-13 ENCOUNTER — Encounter: Payer: Self-pay | Admitting: Neurology

## 2017-11-13 VITALS — BP 105/70 | HR 74 | Ht 64.0 in | Wt 142.0 lb

## 2017-11-13 DIAGNOSIS — I671 Cerebral aneurysm, nonruptured: Secondary | ICD-10-CM

## 2017-11-13 DIAGNOSIS — R42 Dizziness and giddiness: Secondary | ICD-10-CM

## 2017-11-13 DIAGNOSIS — H53452 Other localized visual field defect, left eye: Secondary | ICD-10-CM | POA: Diagnosis not present

## 2017-11-13 DIAGNOSIS — H53132 Sudden visual loss, left eye: Secondary | ICD-10-CM

## 2017-11-13 DIAGNOSIS — Z8249 Family history of ischemic heart disease and other diseases of the circulatory system: Secondary | ICD-10-CM

## 2017-11-13 DIAGNOSIS — H93A2 Pulsatile tinnitus, left ear: Secondary | ICD-10-CM

## 2017-11-13 DIAGNOSIS — G43109 Migraine with aura, not intractable, without status migrainosus: Secondary | ICD-10-CM | POA: Diagnosis not present

## 2017-11-13 MED ORDER — CYCLOBENZAPRINE HCL 5 MG PO TABS: 5.0000 mg | ORAL_TABLET | Freq: Every day | ORAL | 6 refills | Status: DC

## 2017-11-13 NOTE — Patient Instructions (Addendum)
MRI of the brain and MRA of the head  Retinal migraine-Retinal migraine is a rare condition that is characterized by repeated attacks of monocular scotomata or blindness lasting less than one hour, associated with or followed by headache. The International Headache Society prefers the term retinal migraine but ocular migraine has been suggested as a more precise term, since both retinal and ciliary circulations may be involved. Occasionally the onset may be abrupt and difficult to distinguish from amaurosis fugax. Irreversible visual loss may be a complication of retinal migraine, although the incidence is uncertain. In one of the largest studies to date that reported 6 new cases and reviewed 40 from the literature, permanent visual loss was eventually present in 20 patients (43 percent). No predictors of irreversible visual loss could be identified, and no consistent pattern of visual loss was observed among these patients. However, permanent visual loss may be less frequent than suggested by these data, since it is likely that cases with such a major complication are more apt to be identified and to be reported (ie, reporting bias).The authors speculated that permanent visual loss resulting from retinal migraine may be a type of migrainous infarction, leading them to suggest the use of prophylactic migraine therapy with antiepileptic or tricyclic medications for patients with this condition.   Cyclobenzaprine tablets What is this medicine? CYCLOBENZAPRINE (sye kloe BEN za preen) is a muscle relaxer. It is used to treat muscle pain, spasms, and stiffness. This medicine may be used for other purposes; ask your health care provider or pharmacist if you have questions. COMMON BRAND NAME(S): Fexmid, Flexeril What should I tell my health care provider before I take this medicine? They need to know if you have any of these conditions: -heart disease, irregular heartbeat, or previous heart attack -liver  disease -thyroid problem -an unusual or allergic reaction to cyclobenzaprine, tricyclic antidepressants, lactose, other medicines, foods, dyes, or preservatives -pregnant or trying to get pregnant -breast-feeding How should I use this medicine? Take this medicine by mouth with a glass of water. Follow the directions on the prescription label. If this medicine upsets your stomach, take it with food or milk. Take your medicine at regular intervals. Do not take it more often than directed. Talk to your pediatrician regarding the use of this medicine in children. Special care may be needed. Overdosage: If you think you have taken too much of this medicine contact a poison control center or emergency room at once. NOTE: This medicine is only for you. Do not share this medicine with others. What if I miss a dose? If you miss a dose, take it as soon as you can. If it is almost time for your next dose, take only that dose. Do not take double or extra doses. What may interact with this medicine? Do not take this medicine with any of the following medications: -certain medicines for fungal infections like fluconazole, itraconazole, ketoconazole, posaconazole, voriconazole -cisapride -dofetilide -dronedarone -halofantrine -levomethadyl -MAOIs like Carbex, Eldepryl, Marplan, Nardil, and Parnate -narcotic medicines for cough -pimozide -thioridazine -ziprasidone This medicine may also interact with the following medications: -alcohol -antihistamines for allergy, cough and cold -certain medicines for anxiety or sleep -certain medicines for cancer -certain medicines for depression like amitriptyline, fluoxetine, sertraline -certain medicines for infection like alfuzosin, chloroquine, clarithromycin, levofloxacin, mefloquine, pentamidine, troleandomycin -certain medicines for irregular heart beat -certain medicines for seizures like phenobarbital, primidone -contrast dyes -general anesthetics like  halothane, isoflurane, methoxyflurane, propofol -local anesthetics like lidocaine, pramoxine, tetracaine -medicines that relax muscles  for surgery -narcotic medicines for pain -other medicines that prolong the QT interval (cause an abnormal heart rhythm) -phenothiazines like chlorpromazine, mesoridazine, prochlorperazine This list may not describe all possible interactions. Give your health care provider a list of all the medicines, herbs, non-prescription drugs, or dietary supplements you use. Also tell them if you smoke, drink alcohol, or use illegal drugs. Some items may interact with your medicine. What should I watch for while using this medicine? Tell your doctor or health care professional if your symptoms do not start to get better or if they get worse. You may get drowsy or dizzy. Do not drive, use machinery, or do anything that needs mental alertness until you know how this medicine affects you. Do not stand or sit up quickly, especially if you are an older patient. This reduces the risk of dizzy or fainting spells. Alcohol may interfere with the effect of this medicine. Avoid alcoholic drinks. If you are taking another medicine that also causes drowsiness, you may have more side effects. Give your health care provider a list of all medicines you use. Your doctor will tell you how much medicine to take. Do not take more medicine than directed. Call emergency for help if you have problems breathing or unusual sleepiness. Your mouth may get dry. Chewing sugarless gum or sucking hard candy, and drinking plenty of water may help. Contact your doctor if the problem does not go away or is severe. What side effects may I notice from receiving this medicine? Side effects that you should report to your doctor or health care professional as soon as possible: -allergic reactions like skin rash, itching or hives, swelling of the face, lips, or tongue -breathing problems -chest pain -fast, irregular  heartbeat -hallucinations -seizures -unusually weak or tired Side effects that usually do not require medical attention (report to your doctor or health care professional if they continue or are bothersome): -headache -nausea, vomiting This list may not describe all possible side effects. Call your doctor for medical advice about side effects. You may report side effects to FDA at 1-800-FDA-1088. Where should I keep my medicine? Keep out of the reach of children. Store at room temperature between 15 and 30 degrees C (59 and 86 degrees F). Keep container tightly closed. Throw away any unused medicine after the expiration date. NOTE: This sheet is a summary. It may not cover all possible information. If you have questions about this medicine, talk to your doctor, pharmacist, or health care provider.  2018 Elsevier/Gold Standard (2014-10-05 12:05:46)  Migraine Headache A migraine headache is a very strong throbbing pain on one side or both sides of your head. Migraines can also cause other symptoms. Talk with your doctor about what things may bring on (trigger) your migraine headaches. Follow these instructions at home: Medicines  Take over-the-counter and prescription medicines only as told by your doctor.  Do not drive or use heavy machinery while taking prescription pain medicine.  To prevent or treat constipation while you are taking prescription pain medicine, your doctor may recommend that you: ? Drink enough fluid to keep your pee (urine) clear or pale yellow. ? Take over-the-counter or prescription medicines. ? Eat foods that are high in fiber. These include fresh fruits and vegetables, whole grains, and beans. ? Limit foods that are high in fat and processed sugars. These include fried and sweet foods. Lifestyle  Avoid alcohol.  Do not use any products that contain nicotine or tobacco, such as cigarettes and e-cigarettes. If  you need help quitting, ask your doctor.  Get at  least 8 hours of sleep every night.  Limit your stress. General instructions   Keep a journal to find out what may bring on your migraines. For example, write down: ? What you eat and drink. ? How much sleep you get. ? Any change in what you eat or drink. ? Any change in your medicines.  If you have a migraine: ? Avoid things that make your symptoms worse, such as bright lights. ? It may help to lie down in a dark, quiet room. ? Do not drive or use heavy machinery. ? Ask your doctor what activities are safe for you.  Keep all follow-up visits as told by your doctor. This is important. Contact a doctor if:  You get a migraine that is different or worse than your usual migraines. Get help right away if:  Your migraine gets very bad.  You have a fever.  You have a stiff neck.  You have trouble seeing.  Your muscles feel weak or like you cannot control them.  You start to lose your balance a lot.  You start to have trouble walking.  You pass out (faint). This information is not intended to replace advice given to you by your health care provider. Make sure you discuss any questions you have with your health care provider. Document Released: 10/04/2007 Document Revised: 07/15/2015 Document Reviewed: 06/13/2015 Elsevier Interactive Patient Education  2018 [employer].

## 2017-11-13 NOTE — Progress Notes (Signed)
[facility]    Provider:  Dr Jaynee Eagles Referring Provider: Heywood Bene, PA-C Primary Care Physician:  Heywood Bene, PA-C  CC:  Visual disturbances  HPI:  Hannah Best is a 37 y.o. female here as requested by Dr. Heywood Bene, PA-C for vision changes. Started several years ago. One days she saw prisms, worsening recently maybe in the last month. Left eye only. Always in the morning upon waking. Lasts 30 minutes - 1 hour. Never followed by a headache. She had here eyes examined and everything is fine. 3x a week. Peripheral blurring with some color. Feels like she has pressure behind the left eye. Constant pressure. No triggers. Nothing makes it better, just goes away on its own and completely reversible. Worse since having her last child. Her mother had ophthalmic aneurysm and she says she hears pulsating in the left ear, pulsatille tinnitus. No other focal neurologic deficits, associated symptoms, inciting events or modifiable factors.  Reviewed notes, labs and imaging from outside physicians, which showed:reviewed Heywood Bene, PA-C referring notes.   Patient was seen by provider due to vision changes in the left eye.  Peripheral vision loss.  Looks like prisms as well as rainbow with lightning boats.  She is able to see at the center of her vision but not out of her peripheral vision.  Is getting worse because the area of prisms are getting larger in the area she can see out of it is getting smaller.  Occurring several times a week increased frequency last 30 to 60 minutes.  Worse when exercising but otherwise unknown triggers.  Also associated with dizziness.  No headaches follow.  She did see an ophthalmologist 6 months ago and told her exam is normal.  We do not have these records but we will request them.  BMP in 2017 with BUN 13 and creatinine 0.53, TSH was normal in 2017, December 2018 BUN 11 and creatinine 0.66.   Review of  Systems: Patient complains of symptoms per HPI as well as the following symptoms: vision loss. Pertinent negatives and positives per HPI. All others negative.   Social History   Socioeconomic History  . Marital status: Married    Spouse name: Not on file  . Number of children: 2  . Years of education: 73  . Highest education level: Some college, no degree  Occupational History  . Not on file  Social Needs  . Financial resource strain: Not on file  . Food insecurity:    Worry: Not on file    Inability: Not on file  . Transportation needs:    Medical: Not on file    Non-medical: Not on file  Tobacco Use  . Smoking status: Former Smoker    Packs/day: 0.15    Years: 1.00    Pack years: 0.15    Types: Cigarettes  . Smokeless tobacco: Never Used  . Tobacco comment: in highschool  Substance and Sexual Activity  . Alcohol use: Yes    Alcohol/week: 1.0 standard drinks    Types: 1 Standard drinks or equivalent per week  . Drug use: No  . Sexual activity: Not on file  Lifestyle  . Physical activity:    Days per week: Not on file    Minutes per session: Not on file  . Stress: Not on file  Relationships  . Social connections:    Talks on phone: Not on file    Gets together: Not on file    Attends religious  service: Not on file    Active member of club or organization: Not on file    Attends meetings of clubs or organizations: Not on file    Relationship status: Not on file  . Intimate partner violence:    Fear of current or ex partner: Not on file    Emotionally abused: Not on file    Physically abused: Not on file    Forced sexual activity: Not on file  Other Topics Concern  . Not on file  Social History Narrative   Lives at home with husband & children   Right handed   Caffeine: 1 cup daily    Family History  Problem Relation Age of Onset  . Diabetes Father   . Hypertension Father   . Allergic rhinitis Father   . Hyperlipidemia Father   . Cancer Maternal Aunt         breast  . Diabetes Paternal Aunt   . Diabetes Paternal Uncle   . Cancer Maternal Grandmother        breast  . Hypertension Maternal Grandmother   . Heart disease Maternal Grandmother   . Stroke Maternal Grandmother   . Hyperlipidemia Maternal Grandmother   . Breast cancer Mother   . Hyperlipidemia Mother   . Hypertension Sister   . Hyperlipidemia Sister   . Other Sister        thyroid issue  . Hyperlipidemia Maternal Grandfather   . Diabetes Paternal Grandmother   . Heart attack Paternal Grandmother   . Allergic rhinitis Son     Past Medical History:  Diagnosis Date  . Gestational diabetes    diet controlled  . Herpes   . Hyperlipidemia     Past Surgical History:  Procedure Laterality Date  . BREAST BIOPSY Bilateral    2 in L, 1 in R  . CESAREAN SECTION N/A 08/09/2015   Procedure: CESAREAN SECTION;  Surgeon: Jerelyn Charles, MD;  Location: [facility];  Service: Obstetrics;  Laterality: N/A;  . COLONOSCOPY    . septorhinoplasty    . thrombosed hemorrhoid    . TONSILLECTOMY AND ADENOIDECTOMY    . TURBINATE REDUCTION    . WISDOM TOOTH EXTRACTION      Current Outpatient Medications  Medication Sig Dispense Refill  . acyclovir (ZOVIRAX) 400 MG tablet Take 400 mg by mouth daily as needed (cold sore).   12  . cyclobenzaprine (FLEXERIL) 5 MG tablet Take 1 tablet (5 mg total) by mouth at bedtime. 30 tablet 6  . Fish Oil-Cholecalciferol (FISH OIL + D3) 1200-1000 MG-UNIT CAPS Take 1 capsule by mouth daily.      No current facility-administered medications for this visit.     Allergies as of 11/13/2017 - Review Complete 11/13/2017  Allergen Reaction Noted  . Latex Anaphylaxis 06/01/2015  . Shea butter Anaphylaxis 08/02/2015  . Ciprofloxacin Other (See Comments) 08/02/2015  . Hydrocodone Nausea And Vomiting 08/02/2015  . Hydromorphone Nausea And Vomiting 07/28/2015  . Oxycodone Nausea And Vomiting 07/28/2015    Vitals: BP 105/70 (BP Location: Right Arm,  Patient Position: Sitting)   Pulse 74   Ht 5\' 4"  (1.626 m)   Wt 142 lb (64.4 kg)   LMP 10/30/2017 (Exact Date)   Breastfeeding? No   BMI 24.37 kg/m  Last Weight:  Wt Readings from Last 1 Encounters:  11/13/17 142 lb (64.4 kg)   Last Height:   Ht Readings from Last 1 Encounters:  11/13/17 5\' 4"  (1.626 m)   Physical exam: Exam: Gen:  NAD, conversant, well nourised,  well groomed                     CV: RRR, no MRG. No Carotid Bruits. No peripheral edema, warm, nontender Eyes: Conjunctivae clear without exudates or hemorrhage  Neuro: Detailed Neurologic Exam  Speech:    Speech is normal; fluent and spontaneous with normal comprehension.  Cognition:    The patient is oriented to person, place, and time;     recent and remote memory intact;     language fluent;     normal attention, concentration,     fund of knowledge Cranial Nerves:    The pupils are equal, round, and reactive to light. The fundi are normal and spontaneous venous pulsations are present. Visual fields are full to finger confrontation. Extraocular movements are intact. Trigeminal sensation is intact and the muscles of mastication are normal. The face is symmetric. The palate elevates in the midline. Hearing intact. Voice is normal. Shoulder shrug is normal. The tongue has normal motion without fasciculations.   Coordination:    Normal finger to nose and heel to shin. Normal rapid alternating movements.   Gait:    Heel-toe and tandem gait are normal.   Motor Observation:    No asymmetry, no atrophy, and no involuntary movements noted. Tone:    Normal muscle tone.    Posture:    Posture is normal. normal erect    Strength:    Strength is V/V in the upper and lower limbs.      Sensation: intact to LT     Reflex Exam:  DTR's:    Deep tendon reflexes in the upper and lower extremities are normal bilaterally.   Toes:    The toes are downgoing bilaterally.   Clonus:    Clonus is absent.         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 Consider verapamil in future. Avoid triptans.   Orders Placed This Encounter  Procedures  . MR BRAIN W WO CONTRAST  . MR MRA HEAD WO CONTRAST   Meds ordered this encounter  Medications  . cyclobenzaprine (FLEXERIL) 5 MG tablet    Sig: Take 1 tablet (5 mg total) by mouth at bedtime.    Dispense:  30 tablet    Refill:  6    Discussed and provided literature: Retinal migraine-Retinal migraine is a rare condition that is characterized by repeated attacks of monocular scotomata or blindness lasting less than one hour, associated with or followed by headache. The International Headache Society prefers the term retinal migraine but ocular migraine has been suggested as a more precise term, since both retinal and ciliary circulations may be involved. Occasionally the onset may be abrupt and difficult to distinguish from amaurosis fugax. Irreversible visual loss may be a complication of retinal migraine, although the incidence is uncertain. In one of the largest studies to date that reported 6 new cases and reviewed 40 from the literature, permanent visual loss was eventually present in 20 patients (43 percent). No predictors of irreversible visual loss could be identified, and no consistent pattern of visual loss was observed among these patients. However, permanent visual loss may be less frequent than suggested by these data, since it is likely that cases with such a major complication are more apt to  be identified and to be reported (ie, reporting bias).The authors speculated that permanent visual loss resulting from retinal migraine may be a type of migrainous infarction, leading them to suggest the use of prophylactic migraine therapy with  antiepileptic or tricyclic medications for patients with this condition.   Cc: Heywood Bene, PA-C   Sarina Ill, MD  [facility] [address] [place], [place] [address]  Phone [phone] Fax [phone]

## 2017-11-17 ENCOUNTER — Encounter: Payer: Self-pay | Admitting: Neurology

## 2017-11-17 DIAGNOSIS — G43109 Migraine with aura, not intractable, without status migrainosus: Secondary | ICD-10-CM | POA: Insufficient documentation

## 2017-11-19 ENCOUNTER — Telehealth: Payer: Self-pay | Admitting: Neurology

## 2017-11-19 NOTE — Telephone Encounter (Signed)
MR Brain w/wo contrast & MRA Head wo contrast Dr. Ihor Dow Auth: [id number] (exp. 11/19/17 to 12/19/17). Patient is scheduled at [facility] for 11/26/17.

## 2017-11-26 ENCOUNTER — Ambulatory Visit: Payer: BLUE CROSS/BLUE SHIELD

## 2017-11-26 DIAGNOSIS — R42 Dizziness and giddiness: Secondary | ICD-10-CM

## 2017-11-26 DIAGNOSIS — H53452 Other localized visual field defect, left eye: Secondary | ICD-10-CM | POA: Diagnosis not present

## 2017-11-26 DIAGNOSIS — H53132 Sudden visual loss, left eye: Secondary | ICD-10-CM | POA: Diagnosis not present

## 2017-11-26 DIAGNOSIS — Z8249 Family history of ischemic heart disease and other diseases of the circulatory system: Secondary | ICD-10-CM

## 2017-11-26 DIAGNOSIS — H93A2 Pulsatile tinnitus, left ear: Secondary | ICD-10-CM | POA: Diagnosis not present

## 2017-11-26 DIAGNOSIS — I671 Cerebral aneurysm, nonruptured: Secondary | ICD-10-CM

## 2017-11-26 MED ORDER — GADOBENATE DIMEGLUMINE 529 MG/ML IV SOLN
13.0000 mL | Freq: Once | INTRAVENOUS | Status: AC | PRN
  Administered 2017-11-26: 13 mL via INTRAVENOUS

## 2017-12-02 ENCOUNTER — Telehealth: Payer: Self-pay

## 2017-12-02 DIAGNOSIS — I671 Cerebral aneurysm, nonruptured: Secondary | ICD-10-CM

## 2017-12-02 DIAGNOSIS — H93A2 Pulsatile tinnitus, left ear: Secondary | ICD-10-CM

## 2017-12-02 DIAGNOSIS — H53452 Other localized visual field defect, left eye: Secondary | ICD-10-CM

## 2017-12-02 DIAGNOSIS — Z8249 Family history of ischemic heart disease and other diseases of the circulatory system: Secondary | ICD-10-CM

## 2017-12-02 DIAGNOSIS — H53132 Sudden visual loss, left eye: Secondary | ICD-10-CM

## 2017-12-02 NOTE — Telephone Encounter (Signed)
-----   Message from Penni Bombard, MD sent at 11/29/2017  8:30 PM EST ----- Possible artifact in internal carotid arteries. Consider repeat MRA vs CTA head study. -VRP

## 2017-12-02 NOTE — Telephone Encounter (Signed)
-----   Message from Penni Bombard, MD sent at 11/29/2017  8:29 PM EST ----- Unremarkable imaging results. Please call patient. Continue current plan. -VRP

## 2017-12-02 NOTE — Telephone Encounter (Signed)
I called pt and explained both of her MRI and MRA head results. I spoke with Dr. Jaynee Eagles and she recommends that pt repeat a CTA head and also a CTA neck be performed. Pt is agreeable to this and understands that she will be contacted to discuss scheduling the studies. Pt verbalized understanding of results. Pt had no questions at this time but was encouraged to call back if questions arise.

## 2017-12-13 ENCOUNTER — Ambulatory Visit
Admission: RE | Admit: 2017-12-13 | Discharge: 2017-12-13 | Disposition: A | Discharge status: Home / Self Care | Payer: BLUE CROSS/BLUE SHIELD | Source: Ambulatory Visit | Attending: Neurology | Admitting: Neurology

## 2017-12-13 DIAGNOSIS — I671 Cerebral aneurysm, nonruptured: Secondary | ICD-10-CM

## 2017-12-13 DIAGNOSIS — H53452 Other localized visual field defect, left eye: Secondary | ICD-10-CM

## 2017-12-13 DIAGNOSIS — Z8249 Family history of ischemic heart disease and other diseases of the circulatory system: Secondary | ICD-10-CM

## 2017-12-13 DIAGNOSIS — H93A2 Pulsatile tinnitus, left ear: Secondary | ICD-10-CM

## 2017-12-13 DIAGNOSIS — H53132 Sudden visual loss, left eye: Secondary | ICD-10-CM

## 2017-12-13 MED ORDER — IOPAMIDOL (ISOVUE-370) INJECTION 76%
75.0000 mL | Freq: Once | INTRAVENOUS | Status: AC | PRN
  Administered 2017-12-13: 75 mL via INTRAVENOUS

## 2017-12-16 ENCOUNTER — Telehealth: Payer: Self-pay | Admitting: *Deleted

## 2017-12-16 NOTE — Telephone Encounter (Signed)
-----   Message from Melvenia Beam, MD sent at 12/15/2017  8:34 PM EST ----- Normal CT angiography of the neck and head. thanks

## 2017-12-16 NOTE — Telephone Encounter (Signed)
Called pt & LVM (ok per DPR) informing pt that her CT-A head & neck were normal and this is good news. Left office number and hours this week in case pt has any questions. Advised that return call is not required.

## 2018-03-04 ENCOUNTER — Other Ambulatory Visit: Payer: Self-pay | Admitting: Physician Assistant

## 2018-03-04 DIAGNOSIS — Z1231 Encounter for screening mammogram for malignant neoplasm of breast: Secondary | ICD-10-CM

## 2018-04-02 ENCOUNTER — Ambulatory Visit: Payer: BLUE CROSS/BLUE SHIELD

## 2018-04-30 ENCOUNTER — Ambulatory Visit: Payer: BLUE CROSS/BLUE SHIELD

## 2018-06-05 ENCOUNTER — Encounter (HOSPITAL_COMMUNITY): Payer: Self-pay | Admitting: *Deleted

## 2018-06-05 ENCOUNTER — Other Ambulatory Visit: Payer: Self-pay

## 2018-06-05 ENCOUNTER — Inpatient Hospital Stay (HOSPITAL_COMMUNITY)
Admission: AD | Admit: 2018-06-05 | Discharge: 2018-06-05 | Disposition: A | Discharge status: Home / Self Care | Payer: BLUE CROSS/BLUE SHIELD | Attending: Obstetrics and Gynecology | Admitting: Obstetrics and Gynecology

## 2018-06-05 ENCOUNTER — Other Ambulatory Visit (HOSPITAL_COMMUNITY): Payer: Self-pay

## 2018-06-05 DIAGNOSIS — Z833 Family history of diabetes mellitus: Secondary | ICD-10-CM | POA: Insufficient documentation

## 2018-06-05 DIAGNOSIS — O280 Abnormal hematological finding on antenatal screening of mother: Secondary | ICD-10-CM | POA: Diagnosis not present

## 2018-06-05 DIAGNOSIS — Z87891 Personal history of nicotine dependence: Secondary | ICD-10-CM | POA: Insufficient documentation

## 2018-06-05 DIAGNOSIS — O209 Hemorrhage in early pregnancy, unspecified: Secondary | ICD-10-CM | POA: Diagnosis not present

## 2018-06-05 DIAGNOSIS — N920 Excessive and frequent menstruation with regular cycle: Secondary | ICD-10-CM

## 2018-06-05 DIAGNOSIS — N939 Abnormal uterine and vaginal bleeding, unspecified: Secondary | ICD-10-CM | POA: Diagnosis present

## 2018-06-05 DIAGNOSIS — O0281 Inappropriate change in quantitative human chorionic gonadotropin (hCG) in early pregnancy: Secondary | ICD-10-CM

## 2018-06-05 DIAGNOSIS — Z8249 Family history of ischemic heart disease and other diseases of the circulatory system: Secondary | ICD-10-CM | POA: Insufficient documentation

## 2018-06-05 DIAGNOSIS — Z8632 Personal history of gestational diabetes: Secondary | ICD-10-CM | POA: Diagnosis not present

## 2018-06-05 DIAGNOSIS — Z91040 Latex allergy status: Secondary | ICD-10-CM | POA: Insufficient documentation

## 2018-06-05 DIAGNOSIS — Z3A01 Less than 8 weeks gestation of pregnancy: Secondary | ICD-10-CM | POA: Insufficient documentation

## 2018-06-05 DIAGNOSIS — Z881 Allergy status to other antibiotic agents status: Secondary | ICD-10-CM | POA: Insufficient documentation

## 2018-06-05 DIAGNOSIS — Z885 Allergy status to narcotic agent status: Secondary | ICD-10-CM | POA: Diagnosis not present

## 2018-06-05 LAB — HCG, QUANTITATIVE, PREGNANCY: hCG, Beta Chain, Quant, S: 55 m[IU]/mL — ABNORMAL HIGH (ref <5)

## 2018-06-05 LAB — POCT PREGNANCY, URINE: Preg Test, Ur: POSITIVE — AB

## 2018-06-05 NOTE — MAU Note (Addendum)
Presents with c/o VB that began night before last.  Also abdominal pain that feels like "period cramps."   States she's "miscarrying", reports 4 positive HPT.   LMP 04/26/2018.

## 2018-06-05 NOTE — Discharge Instructions (Signed)
Coping with Pregnancy Loss  Pregnancy loss can happen any time during a pregnancy. Often the cause is not known. It is rarely because of anything you did. Pregnancy loss in early pregnancy (during the first trimester) is called a miscarriage. This type of pregnancy loss is the most common. Pregnancy loss that happens after 20 weeks of pregnancy is called fetal demise if the baby's heart stops beating before birth. Fetal demise is much less common. Some women experience spontaneous labor shortly after fetal demise resulting in a stillborn birth (stillbirth).  Any pregnancy loss can be devastating. You will need to recover both physically and emotionally. Most women are able to get pregnant again after a pregnancy loss and deliver a healthy baby.  How to manage emotional recovery    Pregnancy loss is very hard emotionally. You may feel many different emotions while you grieve. You may feel sad and angry. You may also feel guilty. It is normal to have periods of crying. Emotional recovery can take longer than physical recovery. It is different for everyone.  Taking these steps can help you cope:  · Remember that it is unlikely you did anything to cause the pregnancy loss.  · Share your thoughts and feelings with friends, family, and your partner. Remember that your partner is also recovering emotionally.  · Make sure you have a good support system, and do not spend too much time alone.  · Meet with a pregnancy loss counselor or join a pregnancy loss support group.  · Get enough sleep and eat a healthy diet. Return to regular exercise when you have recovered physically.  · Do not use drugs or alcohol to manage your emotions.  · Consider seeing a mental health professional to help you recover emotionally.  · Ask a friend or loved one to help you decide what to do with any clothing and nursery items you received for your baby.  In the case of a stillbirth, many women benefit from taking additional steps in the grieving  process. You may want to:  · Hold your baby after the birth.  · Name your baby.  · Request a birth certificate.  · Create a keepsake such as handprints or footprints.  · Dress your baby and have a picture taken.  · Make funeral arrangements.  · Ask for a baptism or blessing.  Hospitals have staff members who can help you with all these arrangements.  How to recognize emotional stress  It is normal to have emotional stress after a pregnancy loss. But emotional stress that lasts a long time or becomes severe requires treatment. Watch out for these signs of severe emotional stress:  · Sadness, anger, or guilt that is not going away and is interfering with your normal activities.  · Relationship problems that have occurred or gotten worse since the pregnancy loss.  · Signs of depression that last longer than 2 weeks. These may include:  ? Sadness.  ? Anxiety.  ? Hopelessness.  ? Loss of interest in activities you enjoy.  ? Inability to concentrate.  ? Trouble sleeping or sleeping too much.  ? Loss of appetite or overeating.  ? Thoughts of death or of hurting yourself.  Follow these instructions at home:  Medicines  · Take over-the-counter and prescription medicines only as told by your health care provider.  Activity  · Rest at home until your energy level returns. Return to your normal activities as told by your health care provider. Ask your health care   provider what activities are safe for you.  General instructions  · Keep all follow-up visits as told by your health care provider. This is important.  · It may be helpful to meet with others who have experienced pregnancy loss. Ask your health care provider about support groups and resources.  · To help you and your partner with the process of grieving, talk with your health care provider or seek counseling.  · When you are ready, meet with your health care provider to discuss steps to take for a future pregnancy.  Where to find more information  · [place] Department of  Health and Human Services Office on Women's Health: www.womenshealth.gov  · American Pregnancy Association: www.americanpregnancy.org  Contact a health care provider if:  · You continue to experience grief, sadness, or lack of motivation for everyday activities, and those feelings do not improve over time.  · You are struggling to recover emotionally, especially if you are using alcohol or substances to help.  Get help right away if:  · You have thoughts of hurting yourself or others.  If you ever feel like you may hurt yourself or others, or have thoughts about taking your own life, get help right away. You can go to your nearest emergency department or call:  · Your local emergency services (911 in the [place]  · A suicide crisis helpline, such as the National Suicide Prevention Lifeline at 1-800-273-8255. This is open 24 hours a day.  Summary  · Any pregnancy loss can be difficult physically and emotionally.  · You may experience many different emotions while you grieve. Emotional recovery can last longer than physical recovery.  · It is normal to have emotional stress after a pregnancy loss. But emotional stress that lasts a long time or becomes severe requires treatment.  · See your health care provider if you are struggling emotionally after a pregnancy loss.  This information is not intended to replace advice given to you by your health care provider. Make sure you discuss any questions you have with your health care provider.  Document Released: 03/07/2017 Document Revised: 03/07/2017 Document Reviewed: 03/07/2017  Elsevier Interactive Patient Education © 2019 Elsevier Inc.

## 2018-06-05 NOTE — MAU Provider Note (Signed)
History     CSN: [id number]  Arrival date and time: 06/05/18 5885   First Provider Initiated Contact with Patient 06/05/18 [id number]      Chief Complaint  Patient presents with  . Vaginal Bleeding  . Abdominal Pain   Hannah Best is a 38 y.o. G3P2002 at [redacted]w[redacted]d who receives care at [facility].  She presents today for Vaginal Bleeding and Abdominal Pain.  She states she had 4 positive home UPT, but she has been bleeding since May 18th.  However, patient states her bleeding increased 2 days ago and she started passing clots and experiencing pain.  She reports the bleeding has since "slowed down."   Patient reports she has been actively trying to conceive for 15 months and has an appt with an infertility specialist on June 3rd.     OB History    Gravida  3   Para  2   Term  2   Preterm      AB      Living  2     SAB      TAB      Ectopic      Multiple  0   Live Births  2           Past Medical History:  Diagnosis Date  . Gestational diabetes    diet controlled  . Herpes   . Hyperlipidemia   . Migraine     Past Surgical History:  Procedure Laterality Date  . BREAST BIOPSY Bilateral    2 in L, 1 in R  . CESAREAN SECTION N/A 08/09/2015   Procedure: CESAREAN SECTION;  Surgeon: Jerelyn Charles, MD;  Location: [place];  Service: Obstetrics;  Laterality: N/A;  . COLONOSCOPY    . septorhinoplasty    . thrombosed hemorrhoid    . TONSILLECTOMY AND ADENOIDECTOMY    . TURBINATE REDUCTION    . WISDOM TOOTH EXTRACTION      Family History  Problem Relation Age of Onset  . Diabetes Father   . Hypertension Father   . Allergic rhinitis Father   . Hyperlipidemia Father   . Cancer Maternal Aunt        breast  . Diabetes Paternal Aunt   . Diabetes Paternal Uncle   . Cancer Maternal Grandmother        breast  . Hypertension Maternal Grandmother   . Heart disease Maternal Grandmother   . Stroke Maternal Grandmother   . Hyperlipidemia Maternal  Grandmother   . Breast cancer Mother   . Hyperlipidemia Mother   . Hypertension Sister   . Hyperlipidemia Sister   . Other Sister        thyroid issue  . Hyperlipidemia Maternal Grandfather   . Diabetes Paternal Grandmother   . Heart attack Paternal Grandmother   . Allergic rhinitis Son     Social History   Tobacco Use  . Smoking status: Former Smoker    Packs/day: 0.15    Years: 1.00    Pack years: 0.15    Types: Cigarettes  . Smokeless tobacco: Never Used  . Tobacco comment: in highschool  Substance Use Topics  . Alcohol use: Yes    Alcohol/week: 1.0 standard drinks    Types: 1 Standard drinks or equivalent per week    Comment: Social  . Drug use: No    Allergies:  Allergies  Allergen Reactions  . Latex Anaphylaxis  . Shea Butter Anaphylaxis  . Ciprofloxacin Other (See Comments)  Muscle spasms in legs, pt states she couldn't walk  . Hydrocodone Nausea And Vomiting  . Hydromorphone Nausea And Vomiting  . Oxycodone Nausea And Vomiting    Medications Prior to Admission  Medication Sig Dispense Refill Last Dose  . acyclovir (ZOVIRAX) 400 MG tablet Take 400 mg by mouth daily as needed (cold sore).   12 Past Week at Unknown time  . Prenatal Vit-Fe Fumarate-FA (MULTIVITAMIN-PRENATAL) 27-0.8 MG TABS tablet Take 1 tablet by mouth daily at 12 noon.   06/03/2018  . cyclobenzaprine (FLEXERIL) 5 MG tablet Take 1 tablet (5 mg total) by mouth at bedtime. 30 tablet 6 More than a month at Unknown time  . Fish Oil-Cholecalciferol (FISH OIL + D3) 1200-1000 MG-UNIT CAPS Take 1 capsule by mouth daily.    05/25/2018    Review of Systems  Constitutional: Negative for chills and fever.  Respiratory: Negative for cough and shortness of breath.   Genitourinary: Positive for vaginal bleeding.  Neurological: Negative for dizziness, light-headedness and headaches.   Physical Exam   Blood pressure 122/77, pulse 71, temperature 98.1 F (36.7 C), temperature source Oral, resp. rate 20,  height 5\' 2"  (1.575 m), weight 64.1 kg, last menstrual period 04/26/2018, SpO2 98 %.  Physical Exam  Constitutional: She is oriented to person, place, and time. She appears well-developed and well-nourished.  HENT:  Head: Normocephalic and atraumatic.  Eyes: Conjunctivae are normal.  Neck: Normal range of motion.  Cardiovascular: Normal rate.  Respiratory: Effort normal.  Musculoskeletal: Normal range of motion.  Neurological: She is alert and oriented to person, place, and time.  Skin: Skin is warm and dry.  Psychiatric: She has a normal mood and affect. Her behavior is normal.    MAU Course  Procedures Results for orders placed or performed during the hospital encounter of 06/05/18 (from the past 24 hour(s))  Pregnancy, urine POC     Status: Abnormal   Collection Time: 06/05/18  8:28 AM  Result Value Ref Range   Preg Test, Ur POSITIVE (A) NEGATIVE  hCG, quantitative, pregnancy     Status: Abnormal   Collection Time: 06/05/18  8:56 AM  Result Value Ref Range   hCG, Beta Chain, Quant, S 55 (H) <5 mIU/mL    MDM Quant hCG Assessment and Plan  5.5 weeks by LMP Vaginal Bleeding  -UPT today faint and indeterminable. -Will perform QhCG and patient informed of POC-agreeable. -Informed of result turnover time-No questions. -Labs ordered -Will await results  Follow Up (11:04 AM)  Lab called regarding results and delay in processing. Reports lab is now being run and will be resulted in 20 minutes.    Follow Up (11:25 AM)  -Quant returns at 74. -Patient informed of results and need for follow up appt. -Discussed pelvic exam with cultures and patient declines. -Will place future order for quant at MAU on Saturday May 30 after 9am. -Patient without questions or concerns. -Encouraged to call or return to MAU if symptoms worsen or with the onset of new symptoms. -Discharged to home in stable condition   Maryann Conners MSN, CNM 06/05/2018, 8:37 AM

## 2018-06-07 ENCOUNTER — Inpatient Hospital Stay (HOSPITAL_COMMUNITY)
Admission: AD | Admit: 2018-06-07 | Discharge: 2018-06-07 | Disposition: A | Discharge status: Home / Self Care | Payer: BLUE CROSS/BLUE SHIELD | Attending: Obstetrics | Admitting: Obstetrics

## 2018-06-07 ENCOUNTER — Other Ambulatory Visit: Payer: Self-pay

## 2018-06-07 DIAGNOSIS — O039 Complete or unspecified spontaneous abortion without complication: Secondary | ICD-10-CM

## 2018-06-07 LAB — HCG, QUANTITATIVE, PREGNANCY: hCG, Beta Chain, Quant, S: 33 m[IU]/mL — ABNORMAL HIGH (ref <5)

## 2018-06-07 NOTE — MAU Provider Note (Signed)
Ms. Hannah Best  is a 38 y.o. G3P2002  at [redacted]w[redacted]d who presents to MAU today for follow-up quant hCG. The patient denies abdominal pain, vaginal bleeding, N/V or fever.   BP 113/76 (BP Location: Right Arm)   Pulse 79   Temp 98.2 F (36.8 C) (Oral)   Resp 18   LMP 04/26/2018   SpO2 99%   GENERAL: Well-developed, well-nourished female in no acute distress.  HEENT: Normocephalic, atraumatic.   LUNGS: Effort normal HEART: Regular rate  SKIN: Warm, dry and without erythema PSYCH: Normal mood and affect   A: 1. Miscarriage    Lab Results  Component Value Date   HCGBETAQNT 33 (H) 06/07/2018   HCGBETAQNT 55 (H) 06/05/2018     P: Discharge home Bleeding precautions Pt has f/u with fertility specialist this week  Jorje Guild, NP  06/07/2018 10:13 AM

## 2018-06-07 NOTE — Discharge Instructions (Signed)
Miscarriage °A miscarriage is the loss of an unborn baby (fetus) before the 20th week of pregnancy. Most miscarriages happen during the first 3 months of pregnancy. Sometimes, a miscarriage can happen before a woman knows that she is pregnant. °Having a miscarriage can be an emotional experience. If you have had a miscarriage, talk with your health care provider about any questions you may have about miscarrying, the grieving process, and your plans for future pregnancy. °What are the causes? °A miscarriage may be caused by: °· Problems with the genes or chromosomes of the fetus. These problems make it impossible for the baby to develop normally. They are often the result of random errors that occur early in the development of the baby, and are not passed from parent to child (not inherited). °· Infection of the cervix or uterus. °· Conditions that affect hormone balance in the body. °· Problems with the cervix, such as the cervix opening and thinning before pregnancy is at term (cervical insufficiency). °· Problems with the uterus. These may include: °? A uterus with an abnormal shape. °? Fibroids in the uterus. °? Congenital abnormalities. These are problems that were present at birth. °· Certain medical conditions. °· Smoking, drinking alcohol, or using drugs. °· Injury (trauma). °In many cases, the cause of a miscarriage is not known. °What are the signs or symptoms? °Symptoms of this condition include: °· Vaginal bleeding or spotting, with or without cramps or pain. °· Pain or cramping in the abdomen or lower back. °· Passing fluid, tissue, or blood clots from the vagina. °How is this diagnosed? °This condition may be diagnosed based on: °· A physical exam. °· Ultrasound. °· Blood tests. °· Urine tests. °How is this treated? °Treatment for a miscarriage is sometimes not necessary if you naturally pass all the tissue that was in your uterus. If necessary, this condition may be treated with: °· Dilation and  curettage (D&C). This is a procedure in which the cervix is stretched open and the lining of the uterus (endometrium) is scraped. This is done only if tissue from the fetus or placenta remains in the body (incomplete miscarriage). °· Medicines, such as: °? Antibiotic medicine, to treat infection. °? Medicine to help the body pass any remaining tissue. °? Medicine to reduce (contract) the size of the uterus. These medicines may be given if you have a lot of bleeding. °If you have Rh negative blood and your baby was Rh positive, you will need a shot of a medicine called Rh immunoglobulinto protect your future babies from Rh blood problems. "Rh-negative" and "Rh-positive" refer to whether or not the blood has a specific protein found on the surface of red blood cells (Rh factor). °Follow these instructions at home: °Medicines ° °· Take over-the-counter and prescription medicines only as told by your health care provider. °· If you were prescribed antibiotic medicine, take it as told by your health care provider. Do not stop taking the antibiotic even if you start to feel better. °· Do not take NSAIDs, such as aspirin and ibuprofen, unless they are approved by your health care provider. These medicines can cause bleeding. °Activity °· Rest as directed. Ask your health care provider what activities are safe for you. °· Have someone help with home and family responsibilities during this time. °General instructions °· Keep track of the number of sanitary pads you use each day and how soaked (saturated) they are. Write down this information. °· Monitor the amount of tissue or blood clots that   help with home and family responsibilities during this time.  General instructions  · Keep track of the number of sanitary pads you use each day and how soaked (saturated) they are. Write down this information.  · Monitor the amount of tissue or blood clots that you pass from your vagina. Save any large amounts of tissue for your health care provider to examine.  · Do not use tampons, douche, or have sex until your health care provider approves.  · To help you and your partner with the process of grieving, talk with your health care provider or seek counseling.  · When you are ready, meet with  your health care provider to discuss any important steps you should take for your health. Also, discuss steps you should take to have a healthy pregnancy in the future.  · Keep all follow-up visits as told by your health care provider. This is important.  Where to find more information  · The American Congress of Obstetricians and Gynecologists: www.acog.org  · [place] Department of Health and Human Services Office of Women’s Health: www.womenshealth.gov  Contact a health care provider if:  · You have a fever or chills.  · You have a foul smelling vaginal discharge.  · You have more bleeding instead of less.  Get help right away if:  · You have severe cramps or pain in your back or abdomen.  · You pass blood clots or tissue from your vagina that is walnut-sized or larger.  · You soak more than 1 regular sanitary pad in an hour.  · You become light-headed or weak.  · You pass out.  · You have feelings of sadness that take over your thoughts, or you have thoughts of hurting yourself.  Summary  · Most miscarriages happen in the first 3 months of pregnancy. Sometimes miscarriage happens before a woman even knows that she is pregnant.  · Follow your health care provider's instruction for home care. Keep all follow-up appointments.  · To help you and your partner with the process of grieving, talk with your health care provider or seek counseling.  This information is not intended to replace advice given to you by your health care provider. Make sure you discuss any questions you have with your health care provider.  Document Released: 06/20/2000 Document Revised: 01/31/2016 Document Reviewed: 01/31/2016  Elsevier Interactive Patient Education © 2019 Elsevier Inc.

## 2018-06-07 NOTE — MAU Note (Signed)
Hannah Best is a 38 y.o. at [redacted]w[redacted]d here in MAU reporting: here for follow up HCG, states she is having light bleeding but no pain. Has not had to change her pad since last night.  Pain score: 0/10  Vitals:   06/07/18 0921  BP: 113/76  Pulse: 79  Resp: 18  Temp: 98.2 F (36.8 C)  SpO2: 99%      Lab orders placed from triage: hcg

## 2018-06-09 DIAGNOSIS — B009 Herpesviral infection, unspecified: Secondary | ICD-10-CM | POA: Insufficient documentation

## 2018-06-10 ENCOUNTER — Encounter (HOSPITAL_COMMUNITY): Payer: Self-pay

## 2018-06-18 ENCOUNTER — Other Ambulatory Visit: Payer: Self-pay

## 2018-06-18 ENCOUNTER — Ambulatory Visit
Admission: RE | Admit: 2018-06-18 | Discharge: 2018-06-18 | Disposition: A | Discharge status: Home / Self Care | Payer: BC Managed Care – PPO | Source: Ambulatory Visit | Attending: Physician Assistant | Admitting: Physician Assistant

## 2018-06-18 DIAGNOSIS — Z1231 Encounter for screening mammogram for malignant neoplasm of breast: Secondary | ICD-10-CM

## 2018-07-23 DIAGNOSIS — Z8481 Family history of carrier of genetic disease: Secondary | ICD-10-CM | POA: Insufficient documentation

## 2019-01-09 NOTE — L&D Delivery Note (Signed)
Delivery Note Hannah Best is a [id number] at [redacted]w[redacted]d who had a spontaneous delivery at 04:41 on 10/20/19  a viable female "Christa See" was delivered via OA.  APGAR: 8, 9; weight pending.  Admitted for PROM, SVE 1cm, history of CSx1. Induced with pitocin. Progressed normally. Received epidural for pain management. Pushed for 17 minutes. Baby was delivered without difficulty. No nuchal cord. Baby placed on maternal abdomen. Delayed cord clamping for 60 seconds. Delivery of placenta was spontaneous. Placenta was found to be intact, 3-vessel cord was noted. The fundus was found to be firm. 1st degree perineal laceration was repaired in the normal sterile fashion with 2-0 vicryl. Estimated blood loss 25cc. Instrument and gauze counts were correct at the end of the procedure.  Placenta status: L&D Mom to postpartum.  Baby to Couplet care / Skin to Skin.  Hannah Best 10/20/2019, 4:57 AM

## 2019-01-24 ENCOUNTER — Emergency Department (HOSPITAL_COMMUNITY)
Admission: EM | Admit: 2019-01-24 | Discharge: 2019-01-25 | Disposition: A | Discharge status: Home / Self Care | Payer: BC Managed Care – PPO | Attending: Emergency Medicine | Admitting: Emergency Medicine

## 2019-01-24 ENCOUNTER — Encounter (HOSPITAL_COMMUNITY): Payer: Self-pay | Admitting: Emergency Medicine

## 2019-01-24 ENCOUNTER — Other Ambulatory Visit: Payer: Self-pay

## 2019-01-24 ENCOUNTER — Emergency Department (HOSPITAL_COMMUNITY): Payer: BC Managed Care – PPO

## 2019-01-24 DIAGNOSIS — Z5321 Procedure and treatment not carried out due to patient leaving prior to being seen by health care provider: Secondary | ICD-10-CM | POA: Diagnosis not present

## 2019-01-24 DIAGNOSIS — M79641 Pain in right hand: Secondary | ICD-10-CM | POA: Insufficient documentation

## 2019-01-24 NOTE — ED Triage Notes (Signed)
C/o R hand pain after injuring hand while working on vehicle, pt states she was accidentally his with rearend of car when it was being thrown. Swelling and scratches R dorsal hand.

## 2019-01-25 NOTE — ED Notes (Signed)
Pt has decided to call her provider [facility]

## 2019-02-27 DIAGNOSIS — Z148 Genetic carrier of other disease: Secondary | ICD-10-CM | POA: Insufficient documentation

## 2019-03-06 ENCOUNTER — Other Ambulatory Visit: Payer: Self-pay | Admitting: Obstetrics

## 2019-03-06 DIAGNOSIS — Z803 Family history of malignant neoplasm of breast: Secondary | ICD-10-CM

## 2019-04-03 LAB — OB RESULTS CONSOLE RPR: RPR: NONREACTIVE

## 2019-04-03 LAB — OB RESULTS CONSOLE RUBELLA ANTIBODY, IGM: Rubella: IMMUNE

## 2019-04-03 LAB — OB RESULTS CONSOLE ABO/RH: RH Type: POSITIVE

## 2019-04-03 LAB — OB RESULTS CONSOLE ANTIBODY SCREEN: Antibody Screen: NEGATIVE

## 2019-04-03 LAB — OB RESULTS CONSOLE GC/CHLAMYDIA
Chlamydia: NEGATIVE
Gonorrhea: NEGATIVE

## 2019-04-03 LAB — OB RESULTS CONSOLE HIV ANTIBODY (ROUTINE TESTING): HIV: NONREACTIVE

## 2019-04-03 LAB — HEPATITIS C ANTIBODY: HCV Ab: NEGATIVE

## 2019-04-03 LAB — OB RESULTS CONSOLE HEPATITIS B SURFACE ANTIGEN: Hepatitis B Surface Ag: NEGATIVE

## 2019-08-11 LAB — OB RESULTS CONSOLE RPR: RPR: NONREACTIVE

## 2019-09-09 ENCOUNTER — Other Ambulatory Visit: Payer: Self-pay

## 2019-09-09 ENCOUNTER — Encounter: Payer: BC Managed Care – PPO | Attending: Obstetrics and Gynecology | Admitting: Registered"

## 2019-09-09 DIAGNOSIS — O24419 Gestational diabetes mellitus in pregnancy, unspecified control: Secondary | ICD-10-CM

## 2019-09-10 ENCOUNTER — Encounter: Payer: Self-pay | Admitting: Registered"

## 2019-09-10 DIAGNOSIS — O24419 Gestational diabetes mellitus in pregnancy, unspecified control: Secondary | ICD-10-CM | POA: Insufficient documentation

## 2019-09-10 NOTE — Progress Notes (Signed)
Patient was seen on 09/09/19 for Gestational Diabetes self-management class at the [facility]. The following learning objectives were met by the patient during this course:   States the definition of Gestational Diabetes  States why dietary management is important in controlling blood glucose  Describes the effects each nutrient has on blood glucose levels  Demonstrates ability to create a balanced meal plan  Demonstrates carbohydrate counting   States when to check blood glucose levels  Demonstrates proper blood glucose monitoring techniques  States the effect of stress and exercise on blood glucose levels  States the importance of limiting caffeine and abstaining from alcohol and smoking  Blood glucose monitor given: pt has meter and checking Blood glucose reading: 92 mg/dL  Patient instructed to monitor glucose levels: FBS: 60 - <95; 1 hour: <140; 2 hour: <120  Patient received handouts:  Nutrition Diabetes and Pregnancy, including carb counting list  Patient will be seen for follow-up as needed.

## 2019-10-07 LAB — OB RESULTS CONSOLE GBS: GBS: NEGATIVE

## 2019-10-19 ENCOUNTER — Encounter (HOSPITAL_COMMUNITY): Payer: Self-pay | Admitting: Obstetrics & Gynecology

## 2019-10-19 ENCOUNTER — Inpatient Hospital Stay (HOSPITAL_COMMUNITY)
Admission: AD | Admit: 2019-10-19 | Discharge: 2019-10-21 | DRG: 806 | Disposition: A | Discharge status: Home / Self Care | Payer: BC Managed Care – PPO | Attending: Obstetrics & Gynecology | Admitting: Obstetrics & Gynecology

## 2019-10-19 ENCOUNTER — Other Ambulatory Visit: Payer: Self-pay

## 2019-10-19 DIAGNOSIS — Z3A37 37 weeks gestation of pregnancy: Secondary | ICD-10-CM | POA: Diagnosis not present

## 2019-10-19 DIAGNOSIS — E039 Hypothyroidism, unspecified: Secondary | ICD-10-CM | POA: Diagnosis present

## 2019-10-19 DIAGNOSIS — A6000 Herpesviral infection of urogenital system, unspecified: Secondary | ICD-10-CM | POA: Diagnosis present

## 2019-10-19 DIAGNOSIS — O9832 Other infections with a predominantly sexual mode of transmission complicating childbirth: Secondary | ICD-10-CM | POA: Diagnosis present

## 2019-10-19 DIAGNOSIS — O4292 Full-term premature rupture of membranes, unspecified as to length of time between rupture and onset of labor: Secondary | ICD-10-CM | POA: Diagnosis present

## 2019-10-19 DIAGNOSIS — Z87891 Personal history of nicotine dependence: Secondary | ICD-10-CM | POA: Diagnosis not present

## 2019-10-19 DIAGNOSIS — O3483 Maternal care for other abnormalities of pelvic organs, third trimester: Secondary | ICD-10-CM | POA: Diagnosis present

## 2019-10-19 DIAGNOSIS — D271 Benign neoplasm of left ovary: Secondary | ICD-10-CM | POA: Diagnosis present

## 2019-10-19 DIAGNOSIS — O4290 Premature rupture of membranes, unspecified as to length of time between rupture and onset of labor, unspecified weeks of gestation: Secondary | ICD-10-CM | POA: Diagnosis present

## 2019-10-19 DIAGNOSIS — O34219 Maternal care for unspecified type scar from previous cesarean delivery: Secondary | ICD-10-CM

## 2019-10-19 DIAGNOSIS — O99284 Endocrine, nutritional and metabolic diseases complicating childbirth: Secondary | ICD-10-CM | POA: Diagnosis present

## 2019-10-19 DIAGNOSIS — O26893 Other specified pregnancy related conditions, third trimester: Secondary | ICD-10-CM | POA: Diagnosis present

## 2019-10-19 DIAGNOSIS — O24420 Gestational diabetes mellitus in childbirth, diet controlled: Secondary | ICD-10-CM | POA: Diagnosis present

## 2019-10-19 DIAGNOSIS — Z20822 Contact with and (suspected) exposure to covid-19: Secondary | ICD-10-CM | POA: Diagnosis present

## 2019-10-19 LAB — TYPE AND SCREEN
ABO/RH(D): A POS
Antibody Screen: NEGATIVE

## 2019-10-19 LAB — CBC
HCT: 39.7 % (ref 36.0–46.0)
Hemoglobin: 13.2 g/dL (ref 12.0–15.0)
MCH: 30.3 pg (ref 26.0–34.0)
MCHC: 33.2 g/dL (ref 30.0–36.0)
MCV: 91.3 fL (ref 80.0–100.0)
Platelets: 192 10*3/uL (ref 150–400)
RBC: 4.35 MIL/uL (ref 3.87–5.11)
RDW: 13.1 % (ref 11.5–15.5)
WBC: 7.7 10*3/uL (ref 4.0–10.5)
nRBC: 0 % (ref 0.0–0.2)

## 2019-10-19 LAB — GLUCOSE, CAPILLARY
Glucose-Capillary: 64 mg/dL — ABNORMAL LOW (ref 70–99)
Glucose-Capillary: 62 mg/dL — ABNORMAL LOW (ref 70–99)

## 2019-10-19 LAB — POCT FERN TEST: POCT Fern Test: POSITIVE

## 2019-10-19 LAB — RESPIRATORY PANEL BY RT PCR (FLU A&B, COVID)
Influenza A by PCR: NEGATIVE
Influenza B by PCR: NEGATIVE
SARS Coronavirus 2 by RT PCR: NEGATIVE

## 2019-10-19 MED ORDER — EPHEDRINE 5 MG/ML INJ: 10.0000 mg | INTRAVENOUS | Status: DC | PRN

## 2019-10-19 MED ORDER — LACTATED RINGERS IV SOLN: 500.0000 mL | INTRAVENOUS | Status: DC | PRN

## 2019-10-19 MED ORDER — OXYTOCIN BOLUS FROM INFUSION
333.0000 mL | Freq: Once | INTRAVENOUS | Status: AC
  Administered 2019-10-20: 333 mL via INTRAVENOUS

## 2019-10-19 MED ORDER — PHENYLEPHRINE 40 MCG/ML (10ML) SYRINGE FOR IV PUSH (FOR BLOOD PRESSURE SUPPORT)
80.0000 ug | PREFILLED_SYRINGE | INTRAVENOUS | Status: DC | PRN
  Filled 2019-10-19: qty 10

## 2019-10-19 MED ORDER — LIDOCAINE HCL (PF) 1 % IJ SOLN: 30.0000 mL | INTRAMUSCULAR | Status: DC | PRN

## 2019-10-19 MED ORDER — LACTATED RINGERS IV SOLN
500.0000 mL | Freq: Once | INTRAVENOUS | Status: AC
  Administered 2019-10-20: 500 mL via INTRAVENOUS

## 2019-10-19 MED ORDER — PHENYLEPHRINE 40 MCG/ML (10ML) SYRINGE FOR IV PUSH (FOR BLOOD PRESSURE SUPPORT): 80.0000 ug | PREFILLED_SYRINGE | INTRAVENOUS | Status: DC | PRN

## 2019-10-19 MED ORDER — FENTANYL-BUPIVACAINE-NACL 0.5-0.125-0.9 MG/250ML-% EP SOLN
12.0000 mL/h | EPIDURAL | Status: DC | PRN
  Filled 2019-10-19: qty 250

## 2019-10-19 MED ORDER — SOD CITRATE-CITRIC ACID 500-334 MG/5ML PO SOLN: 30.0000 mL | ORAL | Status: DC | PRN

## 2019-10-19 MED ORDER — OXYTOCIN-SODIUM CHLORIDE 30-0.9 UT/500ML-% IV SOLN
1.0000 m[IU]/min | INTRAVENOUS | Status: DC
  Administered 2019-10-19: 2 m[IU]/min via INTRAVENOUS
  Filled 2019-10-19: qty 500

## 2019-10-19 MED ORDER — VALACYCLOVIR HCL 500 MG PO TABS
500.0000 mg | ORAL_TABLET | Freq: Two times a day (BID) | ORAL | Status: DC
  Administered 2019-10-19: 500 mg via ORAL
  Filled 2019-10-19: qty 1

## 2019-10-19 MED ORDER — LEVOTHYROXINE SODIUM 75 MCG PO TABS
75.0000 ug | ORAL_TABLET | Freq: Every day | ORAL | Status: DC
  Administered 2019-10-20: 75 ug via ORAL
  Filled 2019-10-19: qty 1

## 2019-10-19 MED ORDER — OXYTOCIN-SODIUM CHLORIDE 30-0.9 UT/500ML-% IV SOLN
2.5000 [IU]/h | INTRAVENOUS | Status: DC
  Administered 2019-10-20: 2.5 [IU]/h via INTRAVENOUS

## 2019-10-19 MED ORDER — LACTATED RINGERS IV SOLN: INTRAVENOUS | Status: DC

## 2019-10-19 MED ORDER — TERBUTALINE SULFATE 1 MG/ML IJ SOLN: 0.2500 mg | Freq: Once | INTRAMUSCULAR | Status: DC | PRN

## 2019-10-19 MED ORDER — DIPHENHYDRAMINE HCL 50 MG/ML IJ SOLN: 12.5000 mg | INTRAMUSCULAR | Status: DC | PRN

## 2019-10-19 MED ORDER — ONDANSETRON HCL 4 MG/2ML IJ SOLN
4.0000 mg | Freq: Four times a day (QID) | INTRAMUSCULAR | Status: DC | PRN
  Administered 2019-10-20: 4 mg via INTRAVENOUS
  Filled 2019-10-19: qty 2

## 2019-10-19 MED ORDER — ACETAMINOPHEN 325 MG PO TABS: 650.0000 mg | ORAL_TABLET | ORAL | Status: DC | PRN

## 2019-10-19 NOTE — MAU Provider Note (Signed)
S: Hannah Best is a 39 y.o. [phone] at [redacted]w[redacted]d  who presents to MAU today complaining of leaking of fluid since 2 days. She denies vaginal bleeding. She endorses contractions. She reports normal fetal movement.    O: BP 127/83 (BP Location: Right Arm)    Pulse 89    Temp 97.9 F (36.6 C) (Oral)    Resp 17    Ht 5\' 4"  (1.626 m)    Wt 70.5 kg    SpO2 99%    BMI 26.67 kg/m  GENERAL: Well-developed, well-nourished female in no acute distress.  HEAD: Normocephalic, atraumatic.  CHEST: Normal effort of breathing, regular heart rate ABDOMEN: Soft, nontender, gravid PELVIC: Normal external female genitalia. Vagina is pink and rugated. Cervix with normal contour, no lesions. Normal discharge.  No pooling.   Cervical exam:  Dilation: 1 Effacement (%): Thick Cervical Position: Middle Station: Ballotable Presentation: Vertex Exam by:: N. Tyray Proch NP    Fetal Monitoring: reactive Baseline: 130 Variability: moderate Accelerations: present, 15x15 Decelerations: absent Contractions: quiet  Results for orders placed or performed during the hospital encounter of 10/19/19 (from the past 24 hour(s))  POCT fern test     Status: Abnormal   Collection Time: 10/19/19  3:38 PM  Result Value Ref Range   POCT Fern Test Positive = ruptured amniotic membanes     A: [facility] at [redacted]w[redacted]d  SROM  P: Report given to RN to contact MD on call for further instructions  Cidney Kirkwood, Gerrie Nordmann, NP 10/19/2019 3:40 PM

## 2019-10-19 NOTE — H&P (Signed)
Hannah Best is a 39 y.o. female [phone] 10w4dpresenting for eval for LOF since Saturday 10/9 evening. She reports no VB or Contractions. Normal FM.   Pregnancy c/b: 1. H/o CS: G3 for breech, G2 was vaginal delivery. Desires TOLAC 2. GDMA1: [place] 356w6dFW 2913g (57.2%), AC 75.4% 3. Hypothyroidism: On synthroid 754m prepregnancy dose was 54m57m. AMA: Normal anatomy scan, declined genetics. Originally tried OI/IUI, this pregnancy was spontaneous conception 5. H/o HTN in G2, on ASA in pregnancy 6. 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 Planning risk reducing BSO postpartum.  7. 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 Gestational diabetes    diet controlled  . Herpes   . Hyperlipidemia   . Migraine    Past Surgical History:  Procedure Laterality Date  . BREAST BIOPSY Bilateral    2 in L, 1 in R  . CESAREAN SECTION N/A 08/09/2015   Procedure: CESAREAN SECTION;  Surgeon: DyannJerelyn Charles  [place] [facility] [place];  Laterality: N/A;  . COLONOSCOPY    . septorhinoplasty    . thrombosed hemorrhoid    . TONSILLECTOMY AND ADENOIDECTOMY    . TURBINATE REDUCTION    . WISDOM TOOTH EXTRACTION     Family History: family history includes Allergic rhinitis in her father and son; Breast cancer in her maternal aunt, maternal grandmother, mother, and paternal grandmother; Cancer in her maternal aunt and maternal grandmother; Diabetes in her father, paternal aunt, paternal grandmother, and paternal uncle; Heart attack in her paternal grandmother; Heart disease in her maternal grandmother; Hyperlipidemia in  her father, maternal grandfather, maternal grandmother, mother, and sister; Hypertension in her father, maternal grandmother, and sister; Other in her sister; Stroke in her maternal grandmother. Social History:  reports that she has quit smoking. Her smoking use included cigarettes. She has a 0.15 pack-year smoking history. She has never used smokeless tobacco. She reports current alcohol use of about 1.0 standard drink of alcohol per week. She reports that she does not use drugs.     Maternal Diabetes: Yes:  Diabetes Type:  Diet controlled Genetic Screening: declined Maternal Ultrasounds/Referrals: Normal. Posterior placenta Fetal Ultrasounds or other Referrals:  None Maternal Substance Abuse:  None. Former smoker Significant Maternal Medications:  Meds include: Other:  synthroid Significant Maternal Lab Results:  Group B Strep negative Other Comments:  None  Review of Systems Per HPI Exam Physical Exam  Dilation: 1 Effacement (%): Thick Station: Ballotable Exam by:: N. Nugent NP  Blood pressure 125/85, pulse 85, temperature 98.2 F (36.8 C), temperature source Oral, resp. rate 18, height '5\' 4"'  (1.626 m), weight 70.5 kg, SpO2 99 %, unknown if currently breastfeeding. NAD, resting comfortably Gravid abdomen Fetal testing: FHR 125, +accels, +moderate variability. Cat 1 Toco quiet Prenatal labs: ABO, Rh:  --/--/PENDING (10/11 1630) Antibody: PENDING (10/11 1630) Rubella: Immune (03/26 0000) RPR: Nonreactive (  08/03 0000)  HBsAg: Negative (03/26 0000)  HIV: Non-reactive (03/26 0000)  GBS: Negative/-- (09/29 0000)   Assessment/Plan: Hannah Best 39 y.o. [id number] at 33w4dhere with PROM 1. Delivery: patient has been ruptured for possibly 48 hours, she was awaiting labor symptoms, she is current 1cm, desires VBAC, counseled on TOLAC vs. RCS, per [facility] VBAC calculator, 77% chance of successful VBAC. [facility]  -Advised to start pitocin, patient  agrees -Discussed epidural, patient undecided 2. GDMA1: check BG q4h 3. Hypothyroidism: On synthroid 770m, prepregnancy dose was 251m4. 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 Planning risk reducing BSO postpartum.  5. 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

## 2019-10-19 NOTE — Progress Notes (Signed)
Report given to Arlis Porta, RN L&D Charge Nurse, room assignment obtained  - 814.

## 2019-10-19 NOTE — MAU Note (Signed)
Pt reports leaking at night for 2 days, reports it clear and does not have a urine odor. Had been losing mucous plug for a few days.  Denies vaginal bleeding. +FM, but decreased.  Some irregular contractions. Was 1 cm last week.

## 2019-10-20 ENCOUNTER — Inpatient Hospital Stay (HOSPITAL_COMMUNITY): Payer: Self-pay | Admitting: Anesthesiology

## 2019-10-20 ENCOUNTER — Encounter (HOSPITAL_COMMUNITY): Payer: Self-pay | Admitting: Anesthesiology

## 2019-10-20 ENCOUNTER — Inpatient Hospital Stay (HOSPITAL_COMMUNITY): Payer: BC Managed Care – PPO | Admitting: Anesthesiology

## 2019-10-20 ENCOUNTER — Encounter (HOSPITAL_COMMUNITY): Payer: Self-pay | Admitting: Obstetrics & Gynecology

## 2019-10-20 DIAGNOSIS — O34219 Maternal care for unspecified type scar from previous cesarean delivery: Secondary | ICD-10-CM

## 2019-10-20 LAB — RPR: RPR Ser Ql: NONREACTIVE

## 2019-10-20 LAB — GLUCOSE, CAPILLARY
Glucose-Capillary: 89 mg/dL (ref 70–99)
Glucose-Capillary: 95 mg/dL (ref 70–99)

## 2019-10-20 MED ORDER — COCONUT OIL OIL
1.0000 "application " | TOPICAL_OIL | Status: DC | PRN
  Administered 2019-10-20: 1 via TOPICAL

## 2019-10-20 MED ORDER — LEVOTHYROXINE SODIUM 50 MCG PO TABS
25.0000 ug | ORAL_TABLET | Freq: Every day | ORAL | Status: DC
  Administered 2019-10-21: 25 ug via ORAL
  Filled 2019-10-20 (×2): qty 1

## 2019-10-20 MED ORDER — ACETAMINOPHEN 325 MG PO TABS: 650.0000 mg | ORAL_TABLET | ORAL | Status: DC | PRN

## 2019-10-20 MED ORDER — ONDANSETRON HCL 4 MG/2ML IJ SOLN: 4.0000 mg | INTRAMUSCULAR | Status: DC | PRN

## 2019-10-20 MED ORDER — BENZOCAINE-MENTHOL 20-0.5 % EX AERO
1.0000 "application " | INHALATION_SPRAY | CUTANEOUS | Status: DC | PRN
  Administered 2019-10-20: 1 via TOPICAL
  Filled 2019-10-20: qty 56

## 2019-10-20 MED ORDER — ONDANSETRON HCL 4 MG PO TABS: 4.0000 mg | ORAL_TABLET | ORAL | Status: DC | PRN

## 2019-10-20 MED ORDER — SODIUM CHLORIDE 0.9% FLUSH: 3.0000 mL | INTRAVENOUS | Status: DC | PRN

## 2019-10-20 MED ORDER — OXYCODONE HCL 5 MG PO TABS: 10.0000 mg | ORAL_TABLET | ORAL | Status: DC | PRN

## 2019-10-20 MED ORDER — SODIUM CHLORIDE 0.9 % IV SOLN: 250.0000 mL | INTRAVENOUS | Status: DC | PRN

## 2019-10-20 MED ORDER — FLEET ENEMA 7-19 GM/118ML RE ENEM: 1.0000 | ENEMA | Freq: Every day | RECTAL | Status: DC | PRN

## 2019-10-20 MED ORDER — OXYCODONE HCL 5 MG PO TABS: 5.0000 mg | ORAL_TABLET | ORAL | Status: DC | PRN

## 2019-10-20 MED ORDER — IBUPROFEN 600 MG PO TABS
600.0000 mg | ORAL_TABLET | Freq: Four times a day (QID) | ORAL | Status: DC
  Administered 2019-10-20 – 2019-10-21 (×5): 600 mg via ORAL
  Filled 2019-10-20 (×6): qty 1

## 2019-10-20 MED ORDER — LIDOCAINE HCL (PF) 1 % IJ SOLN
INTRAMUSCULAR | Status: DC | PRN
  Administered 2019-10-20: 5 mL via EPIDURAL

## 2019-10-20 MED ORDER — DIPHENHYDRAMINE HCL 25 MG PO CAPS: 25.0000 mg | ORAL_CAPSULE | Freq: Four times a day (QID) | ORAL | Status: DC | PRN

## 2019-10-20 MED ORDER — SODIUM CHLORIDE 0.9% FLUSH: 3.0000 mL | Freq: Two times a day (BID) | INTRAVENOUS | Status: DC

## 2019-10-20 MED ORDER — WITCH HAZEL-GLYCERIN EX PADS: 1.0000 "application " | MEDICATED_PAD | CUTANEOUS | Status: DC | PRN

## 2019-10-20 MED ORDER — DIBUCAINE (PERIANAL) 1 % EX OINT: 1.0000 "application " | TOPICAL_OINTMENT | CUTANEOUS | Status: DC | PRN

## 2019-10-20 MED ORDER — SENNOSIDES-DOCUSATE SODIUM 8.6-50 MG PO TABS
2.0000 | ORAL_TABLET | ORAL | Status: DC
  Administered 2019-10-21: 2 via ORAL
  Filled 2019-10-20: qty 2

## 2019-10-20 MED ORDER — FENTANYL 2.5 MCG/ML BUPIVACAINE 1/10 % EPIDURAL INFUSION (WH - ANES)
INTRAMUSCULAR | Status: DC | PRN
  Administered 2019-10-20: 12 mL/h via EPIDURAL

## 2019-10-20 MED ORDER — SIMETHICONE 80 MG PO CHEW: 80.0000 mg | CHEWABLE_TABLET | ORAL | Status: DC | PRN

## 2019-10-20 MED ORDER — BISACODYL 10 MG RE SUPP: 10.0000 mg | Freq: Every day | RECTAL | Status: DC | PRN

## 2019-10-20 MED ORDER — PRENATAL MULTIVITAMIN CH
1.0000 | ORAL_TABLET | Freq: Every day | ORAL | Status: DC
  Administered 2019-10-20 – 2019-10-21 (×2): 1 via ORAL
  Filled 2019-10-20 (×2): qty 1

## 2019-10-20 NOTE — Progress Notes (Signed)
Patient is eating, ambulating, voiding.  Pain control is good.  Vitals:   10/20/19 0600 10/20/19 0615 10/20/19 0630 10/20/19 0640  BP: 123/79 126/79 (!) 128/95 128/74  Pulse: 70 75 81 84  Resp: 18 18 18 18   Temp:      TempSrc:      SpO2:      Weight:      Height:        Fundus firm Perineum without swelling.  Lab Results  Component Value Date   WBC 7.7 10/19/2019   HGB 13.2 10/19/2019   HCT 39.7 10/19/2019   MCV 91.3 10/19/2019   PLT 192 10/19/2019    --/--/A POS (10/11 1630)/RI  A/P Post partum day 0.  Routine care.  Expect d/c 1-2 days.    Daria Pastures

## 2019-10-20 NOTE — Anesthesia Postprocedure Evaluation (Signed)
Anesthesia Post Note  Patient: Hannah Best  Procedure(s) Performed: AN AD [place]     Patient location during evaluation: Mother Baby Anesthesia Type: Epidural Level of consciousness: awake and alert and oriented Pain management: satisfactory to patient Vital Signs Assessment: post-procedure vital signs reviewed and stable Respiratory status: spontaneous breathing and nonlabored ventilation Cardiovascular status: stable Postop Assessment: no headache, no backache, no signs of nausea or vomiting, adequate PO intake, patient able to bend at knees and able to ambulate (patient up walking) Anesthetic complications: no   No complications documented.  Last Vitals:  Vitals:   10/20/19 1231 10/20/19 1627  BP: 122/81 128/84  Pulse: 78 72  Resp: 16 18  Temp: 36.8 C 36.8 C  SpO2: 98% 99%    Last Pain:  Vitals:   10/20/19 1627  TempSrc: Oral  PainSc: 0-No pain   Pain Goal:                Epidural/Spinal Function Cutaneous sensation: Normal sensation (10/20/19 1627), Patient able to flex knees: Yes (10/20/19 1627), Patient able to lift hips off bed: Yes (10/20/19 1627), Back pain beyond tenderness at insertion site: No (10/20/19 1627), Progressively worsening motor and/or sensory loss: No (10/20/19 1627), Bowel and/or bladder incontinence post epidural: No (10/20/19 1627)  Willa Rough

## 2019-10-20 NOTE — Anesthesia Postprocedure Evaluation (Deleted)
Anesthesia Post Note  Patient: Hannah Best  Procedure(s) Performed: AN AD [place]     Patient location during evaluation: Mother Baby Anesthesia Type: Epidural Level of consciousness: awake and alert and oriented Pain management: satisfactory to patient Vital Signs Assessment: post-procedure vital signs reviewed and stable Respiratory status: spontaneous breathing and nonlabored ventilation Cardiovascular status: stable Postop Assessment: no headache, no backache, no signs of nausea or vomiting, adequate PO intake, patient able to bend at knees and able to ambulate (patient up walking) Anesthetic complications: no   No complications documented.  Last Vitals:  Vitals:   10/20/19 1231 10/20/19 1627  BP: 122/81 128/84  Pulse: 78 72  Resp: 16 18  Temp: 36.8 C 36.8 C  SpO2: 98% 99%    Last Pain:  Vitals:   10/20/19 1627  TempSrc: Oral  PainSc: 0-No pain   Pain Goal:                Epidural/Spinal Function Cutaneous sensation: Normal sensation (10/20/19 1627), Patient able to flex knees: Yes (10/20/19 1627), Patient able to lift hips off bed: Yes (10/20/19 1627), Back pain beyond tenderness at insertion site: No (10/20/19 1627), Progressively worsening motor and/or sensory loss: No (10/20/19 1627), Bowel and/or bladder incontinence post epidural: No (10/20/19 1627)  Willa Rough

## 2019-10-20 NOTE — Anesthesia Preprocedure Evaluation (Deleted)
Anesthesia Evaluation  Patient identified by MRN, date of birth, ID band Patient awake    Reviewed: Allergy & Precautions, NPO status , Patient's Chart, lab work & pertinent test results  Airway        Dental   Pulmonary former smoker,           Cardiovascular negative cardio ROS       Neuro/Psych    GI/Hepatic negative GI ROS, Neg liver ROS,   Endo/Other  Hypothyroidism   Renal/GU negative Renal ROS     Musculoskeletal   Abdominal   Peds  Hematology Lab Results      Component                Value               Date                      WBC                      7.7                 10/19/2019                HGB                      13.2                10/19/2019                HCT                      39.7                10/19/2019                MCV                      91.3                10/19/2019                PLT                      192                 10/19/2019              Anesthesia Other Findings   Reproductive/Obstetrics (+) Pregnancy                             Anesthesia Physical Anesthesia Plan  ASA: II  Anesthesia Plan: Epidural   Post-op Pain Management:    Induction:   PONV Risk Score and Plan:   Airway Management Planned:   Additional Equipment: None  Intra-op Plan:   Post-operative Plan:   Informed Consent:   Plan Discussed with:   Anesthesia Plan Comments: (All See List : Latex  37.6 Wk G4P2 for LEA  )      Anesthesia Quick Evaluation

## 2019-10-20 NOTE — Anesthesia Postprocedure Evaluation (Signed)
Anesthesia Post Note  Patient: Hannah Best  Procedure(s) Performed: AN AD [place]     Patient location during evaluation: Mother Baby Anesthesia Type: Epidural Level of consciousness: awake and alert Pain management: pain level controlled Vital Signs Assessment: post-procedure vital signs reviewed and stable Respiratory status: spontaneous breathing, nonlabored ventilation and respiratory function stable Cardiovascular status: stable Postop Assessment: no headache, no backache and epidural receding Anesthetic complications: no   No complications documented.  Last Vitals:  Vitals:   10/20/19 0740 10/20/19 1128  BP: 109/71 125/81  Pulse: 83 76  Resp:    Temp: 36.6 C   SpO2:      Last Pain:  Vitals:   10/20/19 0740  TempSrc: Oral  PainSc:    Pain Goal:                   [employer]

## 2019-10-20 NOTE — Progress Notes (Signed)
Nurse removed epidural per order. Catheter intact.

## 2019-10-20 NOTE — Progress Notes (Signed)
Epidural capped following delivery at 0458.

## 2019-10-21 LAB — CBC
HCT: 35.8 % — ABNORMAL LOW (ref 36.0–46.0)
Hemoglobin: 12 g/dL (ref 12.0–15.0)
MCH: 30.8 pg (ref 26.0–34.0)
MCHC: 33.5 g/dL (ref 30.0–36.0)
MCV: 91.8 fL (ref 80.0–100.0)
Platelets: 148 10*3/uL — ABNORMAL LOW (ref 150–400)
RBC: 3.9 MIL/uL (ref 3.87–5.11)
RDW: 13.3 % (ref 11.5–15.5)
WBC: 8.1 10*3/uL (ref 4.0–10.5)
nRBC: 0 % (ref 0.0–0.2)

## 2019-10-21 MED ORDER — LIDOCAINE HCL (PF) 1 % IJ SOLN
INTRAMUSCULAR | Status: DC | PRN
  Administered 2019-10-20: 5 mL via EPIDURAL

## 2019-10-21 MED ORDER — BUPIVACAINE HCL (PF) 0.75 % IJ SOLN
INTRAMUSCULAR | Status: DC | PRN
Start: 2019-10-20 — End: 2019-10-21
  Administered 2019-10-20: 12 mL/h via EPIDURAL

## 2019-10-21 NOTE — Anesthesia Postprocedure Evaluation (Signed)
Anesthesia Post Note  Patient: Hannah Best  Procedure(s) Performed: AN AD [facility] EPIDURAL     Anesthesia Post Evaluation No complications documented.  Last Vitals:  Vitals:   10/20/19 2020 10/21/19 0545  BP: 124/86 121/82  Pulse: 74 69  Resp: 17 17  Temp: 36.7 C 36.4 C  SpO2: 99% 99%    Last Pain:  Vitals:   10/21/19 1107  TempSrc:   PainSc: 1    Pain Goal:                   Barnet Glasgow

## 2019-10-21 NOTE — Discharge Summary (Signed)
Postpartum Discharge Summary      Patient Name: Hannah Best DOB: 12/26/1980 MRN: [id number]  Date of admission: 10/19/2019 Delivery date:10/20/2019  Delivering provider: Lyda Kalata K  Date of discharge: 10/21/2019  Admitting diagnosis: PROM (premature rupture of membranes) [O42.90] Intrauterine pregnancy: [redacted]w[redacted]d    Secondary diagnosis:  Active Problems:   PROM (premature rupture of membranes)   Hypothyroidism   VBAC (vaginal birth after Cesarean)  Additional problems: none   Discharge diagnosis: VBAC                                              Post partum procedures:none Augmentation: Pitocin Complications: None  Hospital course: Induction of Labor With Vaginal Delivery   39y.o. yo [phone] 347w5das admitted to the hospital 10/19/2019 for induction of labor.  Indication for induction: PROM.  Patient had an uncomplicated labor course as follows: Membrane Rupture Time/Date: 12:00 AM ,10/18/2019   Delivery Method:VBAC, Spontaneous  Episiotomy: None  Lacerations:  1st degree;Perineal  Details of delivery can be found in separate delivery note.  Patient had a routine postpartum course. Patient is discharged home 10/21/19.  Newborn Data: Birth date:10/20/2019  Birth time:4:41 AM  Gender:Female  Living status:Living  Apgars:8 ,9  Weight:2835 g   Magnesium Sulfate received: No BMZ received: No Rhophylac:N/A MMR:N/A T-DaP:Given prenatally Flu: No Transfusion:No  Physical exam  Vitals:   10/20/19 1231 10/20/19 1627 10/20/19 2020 10/21/19 0545  BP: 122/81 128/84 124/86 121/82  Pulse: 78 72 74 69  Resp: '16 18 17 17  ' Temp: 98.3 F (36.8 C) 98.3 F (36.8 C) 98 F (36.7 C) 97.6 F (36.4 C)  TempSrc: Oral Oral Oral Oral  SpO2: 98% 99% 99% 99%  Weight:      Height:       General: alert, cooperative and no distress Lochia: appropriate Uterine Fundus: firm  DVT Evaluation: No evidence of DVT seen on physical exam. Labs: Lab Results   Component Value Date   WBC 8.1 10/21/2019   HGB 12.0 10/21/2019   HCT 35.8 (L) 10/21/2019   MCV 91.8 10/21/2019   PLT 148 (L) 10/21/2019   CMP Latest Ref Rng & Units 08/08/2015  Glucose 65 - 99 mg/dL 76  BUN 6 - 20 mg/dL 13  Creatinine 0.44 - 1.00 mg/dL 0.53  Sodium 135 - 145 mmol/L 135  Potassium 3.5 - 5.1 mmol/L 3.3(L)  Chloride 101 - 111 mmol/L 106  CO2 22 - 32 mmol/L 21(L)  Calcium 8.9 - 10.3 mg/dL 8.8(L)   EdFlavia Shippercore: Edinburgh Postnatal Depression Scale Screening Tool 10/20/2019  I have been able to laugh and see the funny side of things. 0  I have looked forward with enjoyment to things. 0  I have blamed myself unnecessarily when things went wrong. 1  I have been anxious or worried for no good reason. 0  I have felt scared or panicky for no good reason. 0  Things have been getting on top of me. 1  I have been so unhappy that I have had difficulty sleeping. 0  I have felt sad or miserable. 0  I have been so unhappy that I have been crying. 0  The thought of harming myself has occurred to me. 0  Edinburgh Postnatal Depression Scale Total 2      After visit meds:  Allergies as of 10/21/2019  Reactions   Latex Anaphylaxis   Shea Butter Anaphylaxis   Ciprofloxacin Other (See Comments)   Muscle spasms in legs, pt states she couldn't walk   Hydrocodone Nausea And Vomiting   Hydromorphone Nausea And Vomiting   Oxycodone Nausea And Vomiting      Medication List    STOP taking these medications   aspirin 81 MG chewable tablet   cyclobenzaprine 5 MG tablet Commonly known as: FLEXERIL   valACYclovir 500 MG tablet Commonly known as: VALTREX     TAKE these medications   acyclovir 400 MG tablet Commonly known as: ZOVIRAX Take 400 mg by mouth daily as needed (cold sore).   Fish Oil + D3 1200-1000 MG-UNIT Caps Take 1 capsule by mouth daily.   levothyroxine 75 MCG tablet Commonly known as: SYNTHROID Take 75 mcg by mouth daily before breakfast.    multivitamin-prenatal 27-0.8 MG Tabs tablet Take 1 tablet by mouth daily at 12 noon.        Discharge home in stable condition Infant Feeding: Breast Infant Disposition:home with mother Discharge instruction: per After Visit Summary and Postpartum booklet. Activity: Advance as tolerated. Pelvic rest for 6 weeks.  Diet: routine diet Anticipated Birth Control: Unsure Postpartum Appointment:4 weeks Additional Postpartum F/U: none Future Appointments:No future appointments.     10/21/2019 Rowland Lathe, MD

## 2019-10-21 NOTE — Anesthesia Procedure Notes (Signed)
Epidural Patient location during procedure: OB Start time: 10/20/2019 2:55 AM End time: 10/20/2019 3:10 AM  Staffing Anesthesiologist: Barnet Glasgow, MD Performed: anesthesiologist   Preanesthetic Checklist Completed: patient identified, IV checked, site marked, risks and benefits discussed, surgical consent, monitors and equipment checked, pre-op evaluation and timeout performed  Epidural Patient position: sitting Prep: DuraPrep and site prepped and draped Patient monitoring: continuous pulse ox and blood pressure Approach: midline Location: L3-L4 Injection technique: LOR air  Needle:  Needle type: Tuohy  Needle gauge: 17 G Needle length: 9 cm and 9 Needle insertion depth: 7 cm Catheter type: closed end flexible Catheter size: 19 Gauge Catheter at skin depth: 12 cm Test dose: negative  Assessment Events: blood not aspirated, injection not painful, no injection resistance, no paresthesia and negative IV test  Additional Notes Patient identified. Risks/Benefits/Options discussed with patient including but not limited to bleeding, infection, nerve damage, paralysis, failed block, incomplete pain control, headache, blood pressure changes, nausea, vomiting, reactions to medication both or allergic, itching and postpartum back pain. Confirmed with bedside nurse the patient's most recent platelet count. Confirmed with patient that they are not currently taking any anticoagulation, have any bleeding history or any family history of bleeding disorders. Patient expressed understanding and wished to proceed. All questions were answered. Sterile technique was used throughout the entire procedure. Please see nursing notes for vital signs. Test dose was given through epidural needle and negative prior to continuing to dose epidural or start infusion. Warning signs of high block given to the patient including shortness of breath, tingling/numbness in hands, complete motor block, or any  concerning symptoms with instructions to call for help. Patient was given instructions on fall risk and not to get out of bed. All questions and concerns addressed with instructions to call with any issues. 1  Attempt (S) . Patient tolerated procedure well.

## 2019-10-21 NOTE — Anesthesia Preprocedure Evaluation (Signed)
Anesthesia Evaluation  Patient identified by MRN, date of birth, ID band Patient awake    Reviewed: Allergy & Precautions, NPO status , Patient's Chart, lab work & pertinent test results  Airway Mallampati: II  TM Distance: >3 FB Neck ROM: Full    Dental no notable dental hx. (+) Teeth Intact   Pulmonary neg pulmonary ROS, former smoker,    Pulmonary exam normal breath sounds clear to auscultation       Cardiovascular negative cardio ROS Normal cardiovascular exam Rhythm:Regular Rate:Normal     Neuro/Psych negative neurological ROS  negative psych ROS   GI/Hepatic negative GI ROS, Neg liver ROS,   Endo/Other  diabetes  Renal/GU negative Renal ROS     Musculoskeletal   Abdominal   Peds  Hematology negative hematology ROS (+)   Anesthesia Other Findings   Reproductive/Obstetrics (+) Pregnancy                             Anesthesia Physical Anesthesia Plan  ASA: III  Anesthesia Plan: Epidural   Post-op Pain Management:    Induction:   PONV Risk Score and Plan:   Airway Management Planned:   Additional Equipment:   Intra-op Plan:   Post-operative Plan:   Informed Consent: I have reviewed the patients History and Physical, chart, labs and discussed the procedure including the risks, benefits and alternatives for the proposed anesthesia with the patient or authorized representative who has indicated his/her understanding and acceptance.       Plan Discussed with:   Anesthesia Plan Comments:         Anesthesia Quick Evaluation

## 2020-03-15 DIAGNOSIS — K625 Hemorrhage of anus and rectum: Secondary | ICD-10-CM | POA: Insufficient documentation

## 2020-03-15 DIAGNOSIS — K640 First degree hemorrhoids: Secondary | ICD-10-CM | POA: Insufficient documentation

## 2020-03-20 IMAGING — MG DIGITAL SCREENING BILATERAL MAMMOGRAM WITH TOMO AND CAD
8 series · 9 of 24 positions shown · non-contrast
Comparison: None.

CLINICAL DATA: Screening.

EXAM:
DIGITAL SCREENING BILATERAL MAMMOGRAM WITH TOMO AND CAD

[R MLO synth-2D]
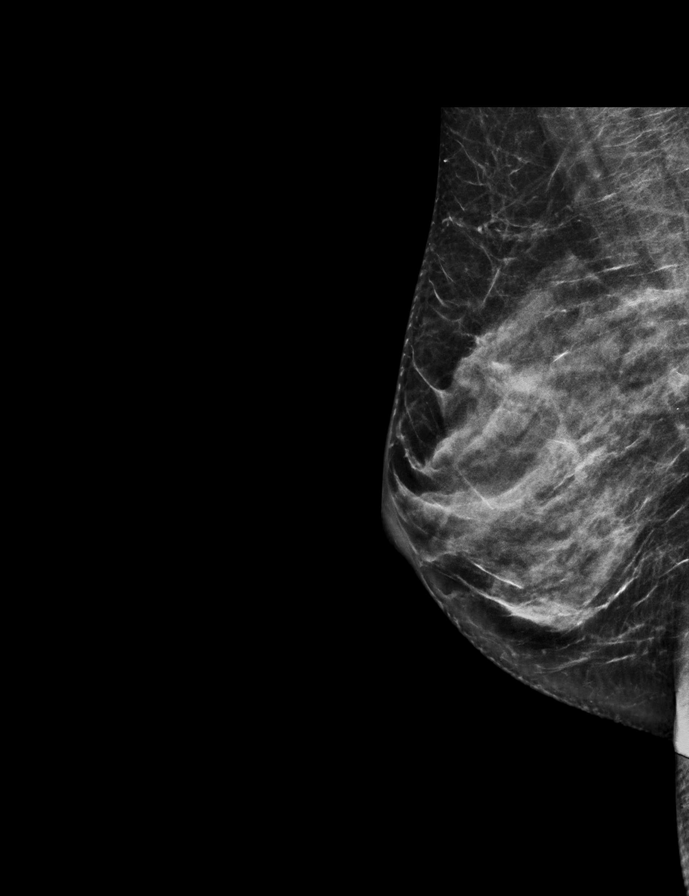

[L CC synth-2D]
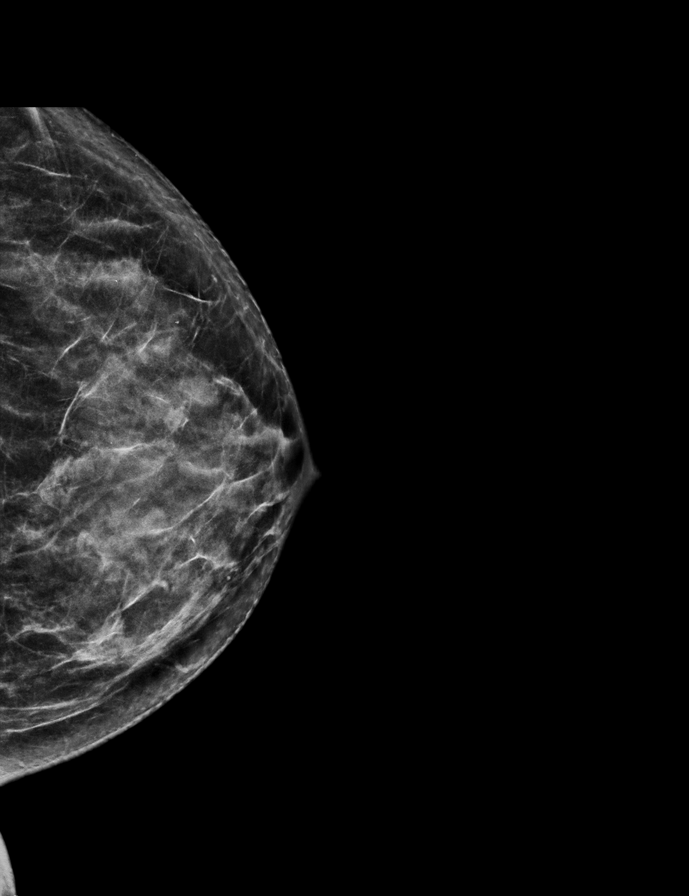

[L MLO synth-2D]
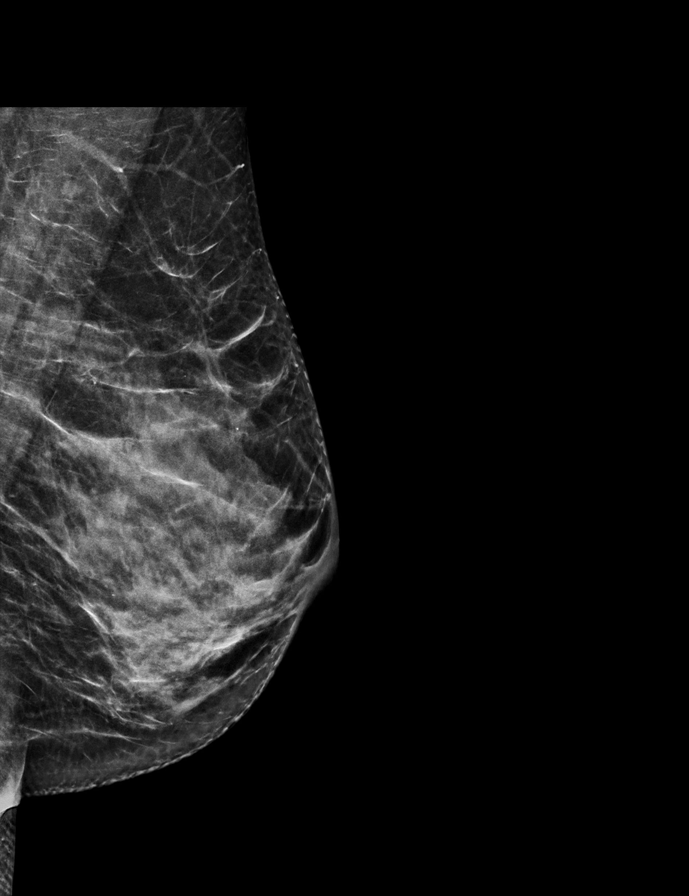

[R CC synth-2D]
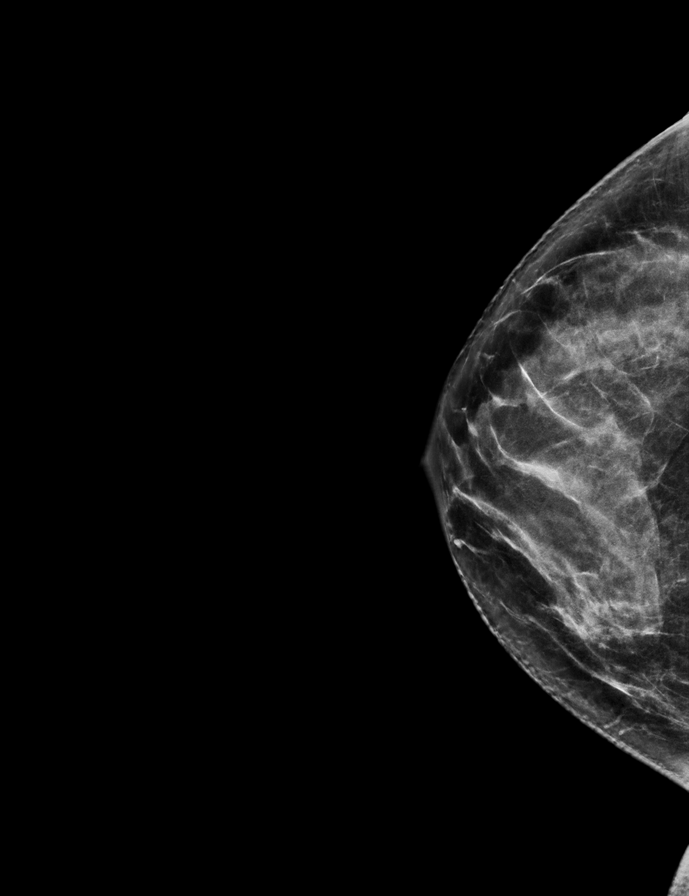

[L MLO tomo · 2 of 60 frames shown]
[frame 20/60]
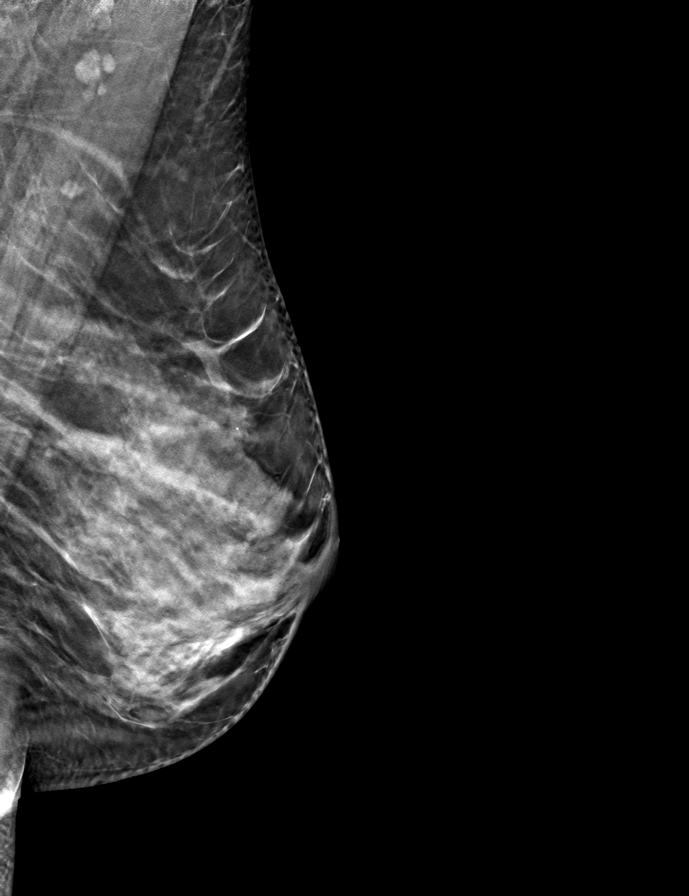
[frame 31/60]
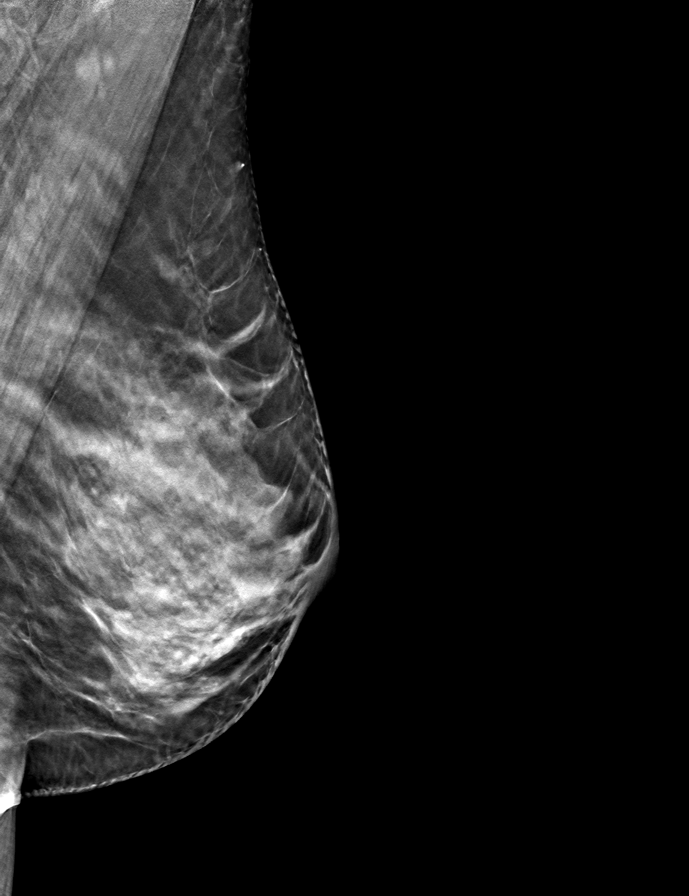

[R MLO tomo · tomo slice 31/61.0]
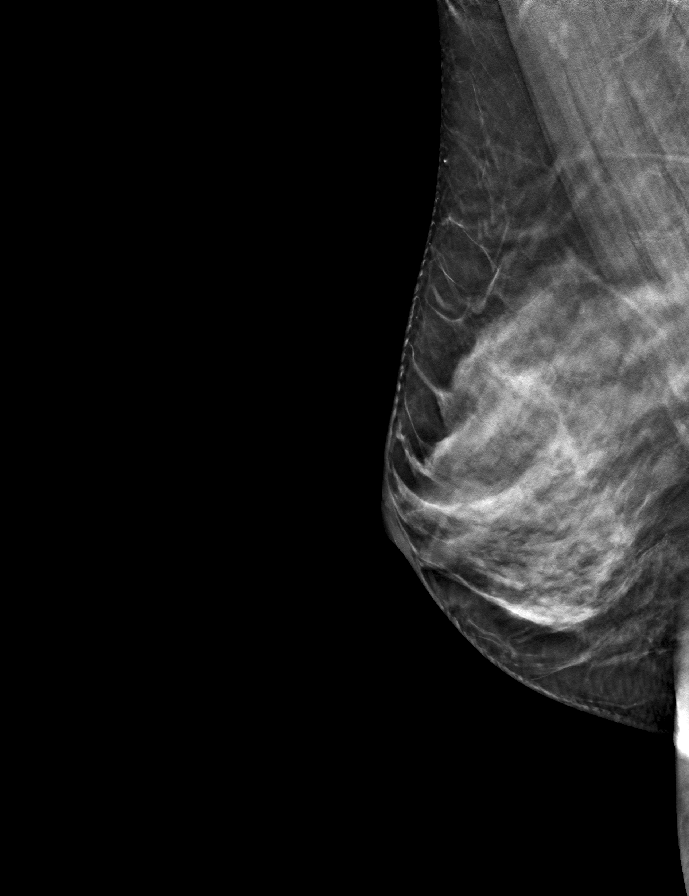

[L CC tomo · tomo slice 29/58.0]
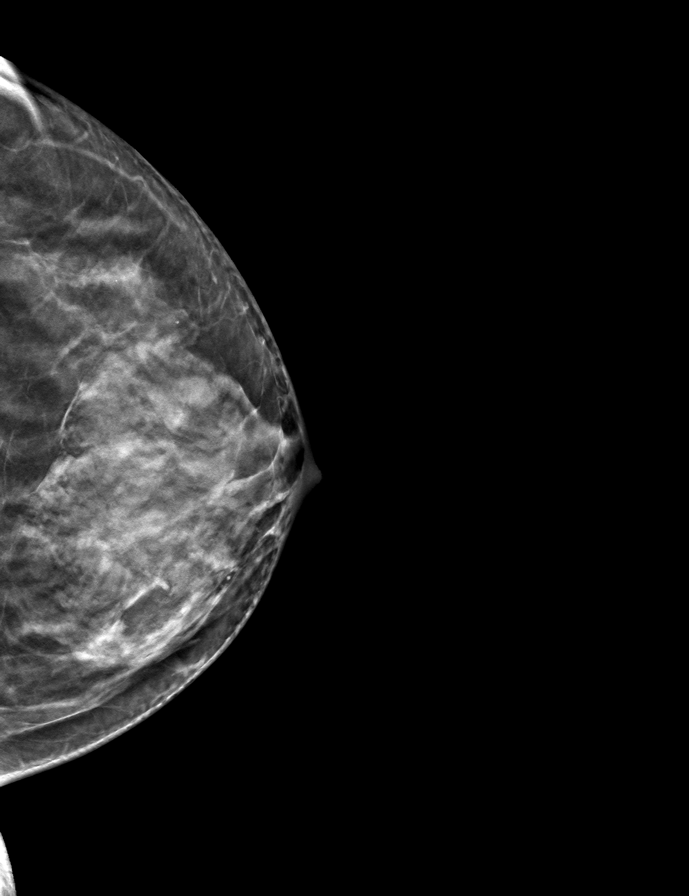

[R CC tomo · tomo slice 32/63.0]
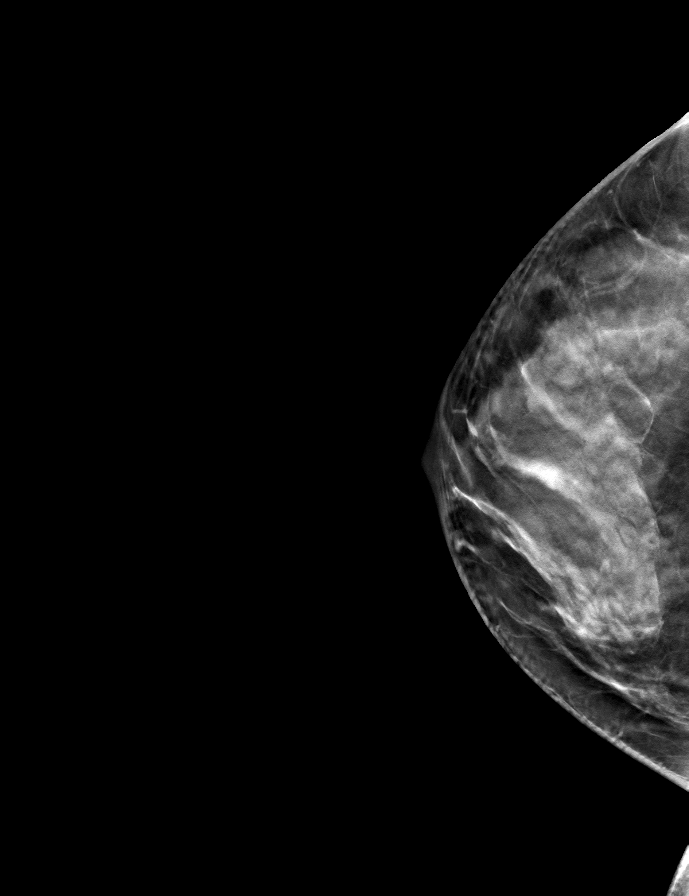

[9 of 24 positions shown; findings below may reference images not displayed]

ACR Breast Density Category d: The breast tissue is extremely dense,
which lowers the sensitivity of mammography.
FINDINGS: There are no findings suspicious for malignancy. Images were
processed with CAD.
IMPRESSION: No mammographic evidence of malignancy. A result letter of this
screening mammogram will be mailed directly to the patient.

RECOMMENDATION:
Screening mammogram in one year. (Code:F8-6-TBV)

BI-RADS CATEGORY  1: Negative.

## 2020-10-05 ENCOUNTER — Other Ambulatory Visit: Payer: Self-pay

## 2020-10-06 ENCOUNTER — Ambulatory Visit: Payer: 59 | Admitting: Emergency Medicine

## 2020-10-06 ENCOUNTER — Encounter: Payer: Self-pay | Admitting: Emergency Medicine

## 2020-10-06 VITALS — BP 118/70 | HR 77 | Temp 98.2°F | Ht 64.0 in | Wt 144.0 lb

## 2020-10-06 DIAGNOSIS — L80 Vitiligo: Secondary | ICD-10-CM | POA: Diagnosis not present

## 2020-10-06 DIAGNOSIS — D271 Benign neoplasm of left ovary: Secondary | ICD-10-CM | POA: Insufficient documentation

## 2020-10-06 DIAGNOSIS — Z23 Encounter for immunization: Secondary | ICD-10-CM

## 2020-10-06 DIAGNOSIS — Z8632 Personal history of gestational diabetes: Secondary | ICD-10-CM | POA: Diagnosis not present

## 2020-10-06 DIAGNOSIS — E039 Hypothyroidism, unspecified: Secondary | ICD-10-CM

## 2020-10-06 DIAGNOSIS — Z7689 Persons encountering health services in other specified circumstances: Secondary | ICD-10-CM

## 2020-10-06 LAB — CBC WITH DIFFERENTIAL/PLATELET
Basophils Absolute: 0.1 10*3/uL (ref 0.0–0.1)
Basophils Relative: 0.9 % (ref 0.0–3.0)
Eosinophils Absolute: 0.3 10*3/uL (ref 0.0–0.7)
Eosinophils Relative: 4.7 % (ref 0.0–5.0)
HCT: 38.9 % (ref 36.0–46.0)
Hemoglobin: 13.3 g/dL (ref 12.0–15.0)
Lymphocytes Relative: 30.4 % (ref 12.0–46.0)
Lymphs Abs: 1.9 10*3/uL (ref 0.7–4.0)
MCHC: 34.1 g/dL (ref 30.0–36.0)
MCV: 89 fl (ref 78.0–100.0)
Monocytes Absolute: 0.4 10*3/uL (ref 0.1–1.0)
Monocytes Relative: 5.9 % (ref 3.0–12.0)
Neutro Abs: 3.6 10*3/uL (ref 1.4–7.7)
Neutrophils Relative %: 58.1 % (ref 43.0–77.0)
Platelets: 205 10*3/uL (ref 150.0–400.0)
RBC: 4.37 Mil/uL (ref 3.87–5.11)
RDW: 12.3 % (ref 11.5–15.5)
WBC: 6.2 10*3/uL (ref 4.0–10.5)

## 2020-10-06 LAB — TSH: TSH: 5.11 u[IU]/mL (ref 0.35–5.50)

## 2020-10-06 LAB — LIPID PANEL
Cholesterol: 243 mg/dL — ABNORMAL HIGH (ref 0–200)
HDL: 75.6 mg/dL (ref >39.00)
LDL Cholesterol: 142 mg/dL — ABNORMAL HIGH (ref 0–99)
NonHDL: 167.21
Total CHOL/HDL Ratio: 3
Triglycerides: 127 mg/dL (ref 0.0–149.0)
VLDL: 25.4 mg/dL (ref 0.0–40.0)

## 2020-10-06 LAB — COMPREHENSIVE METABOLIC PANEL
ALT: 12 U/L (ref 0–35)
AST: 21 U/L (ref 0–37)
Albumin: 4.5 g/dL (ref 3.5–5.2)
Alkaline Phosphatase: 77 U/L (ref 39–117)
BUN: 18 mg/dL (ref 6–23)
CO2: 29 mEq/L (ref 19–32)
Calcium: 9.4 mg/dL (ref 8.4–10.5)
Chloride: 103 mEq/L (ref 96–112)
Creatinine, Ser: 0.74 mg/dL (ref 0.40–1.20)
GFR: 100.23 mL/min (ref >60.00)
Glucose, Bld: 83 mg/dL (ref 70–99)
Potassium: 3.8 mEq/L (ref 3.5–5.1)
Sodium: 140 mEq/L (ref 135–145)
Total Bilirubin: 0.4 mg/dL (ref 0.2–1.2)
Total Protein: 7.6 g/dL (ref 6.0–8.3)

## 2020-10-06 LAB — HEMOGLOBIN A1C: Hgb A1c MFr Bld: 5.5 % (ref 4.6–6.5)

## 2020-10-06 NOTE — Assessment & Plan Note (Signed)
Sees gynecologist on a regular basis.  Stable.

## 2020-10-06 NOTE — Assessment & Plan Note (Signed)
Hemoglobin A1c done today.  Diet and nutrition discussed.

## 2020-10-06 NOTE — Progress Notes (Signed)
Hannah Best 40 y.o.   Chief Complaint  Patient presents with   New Patient (Initial Visit)    Needed a pcp to manage thyroid medication    HISTORY OF PRESENT ILLNESS: This is a 40 y.o. female first visit to this office, here to establish care with me. Has a history of hypothyroidism presently taking Synthroid 25 mcg daily. 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 Also history of gestational diabetes. No other complaints or medical concerns today.  HPI   Prior to Admission medications   Medication Sig Start Date End Date Taking? Authorizing Provider  acyclovir (ZOVIRAX) 400 MG tablet Take 400 mg by mouth daily as needed (cold sore).  07/25/15  Yes [provider]  Fish Oil-Cholecalciferol (FISH OIL + D3) 1200-1000 MG-UNIT CAPS Take 1 capsule by mouth daily.    Yes [provider]  levothyroxine (SYNTHROID) 75 MCG tablet Take 75 mcg by mouth daily before breakfast.   Yes [provider]  Prenatal Vit-Fe Fumarate-FA (MULTIVITAMIN-PRENATAL) 27-0.8 MG TABS tablet Take 1 tablet by mouth daily at 12 noon.   Yes [provider]    Allergies  Allergen Reactions   Latex Anaphylaxis   Shea Butter Anaphylaxis   Ciprofloxacin Other (See Comments)    Muscle spasms in legs, pt states she couldn't walk   Hydrocodone Nausea And Vomiting   Hydromorphone Nausea And Vomiting   Oxycodone Nausea And Vomiting    Patient Active Problem List   Diagnosis Date Noted   Hypothyroidism 10/19/2019    Past Medical History:  Diagnosis Date   Gestational diabetes    diet controlled   Herpes    Hyperlipidemia    Migraine     Past Surgical History:  Procedure Laterality Date   BREAST BIOPSY Bilateral    2 in L, 1 in R   CESAREAN SECTION N/A 08/09/2015   Procedure: CESAREAN SECTION;  Surgeon: Jerelyn Charles, MD;  Location: [facility];  Service: Obstetrics;  Laterality: N/A;   COLONOSCOPY     septorhinoplasty      thrombosed hemorrhoid     TONSILLECTOMY     TONSILLECTOMY AND ADENOIDECTOMY     TURBINATE REDUCTION     WISDOM TOOTH EXTRACTION      Social History   Socioeconomic History   Marital status: Married    Spouse name: Not on file   Number of children: 2   Years of education: 14   Highest education level: Some college, no degree  Occupational History   Not on file  Tobacco Use   Smoking status: Former    Packs/day: 0.15    Years: 1.00    Pack years: 0.15    Types: Cigarettes   Smokeless tobacco: Never   Tobacco comments:    in highschool  Vaping Use   Vaping Use: Never used  Substance and Sexual Activity   Alcohol use: Yes    Alcohol/week: 1.0 standard drink    Types: 1 Standard drinks or equivalent per week    Comment: Social   Drug use: No   Sexual activity: Yes  Other Topics Concern   Not on file  Social History Narrative   Lives at home with husband & children   Right handed   Caffeine: 1 cup daily   Social Determinants of Health   Financial Resource Strain: Not on file  Food Insecurity: Not on file  Transportation Needs: Not on file  Physical Activity: Not on file  Stress: Not  on file  Social Connections: Not on file  Intimate Partner Violence: Not on file    Family History  Problem Relation Age of Onset   Diabetes Father    Hypertension Father    Allergic rhinitis Father    Hyperlipidemia Father    Cancer Maternal Aunt        breast   Breast cancer Maternal Aunt    Diabetes Paternal Aunt    Diabetes Paternal Uncle    Cancer Maternal Grandmother        breast   Hypertension Maternal Grandmother    Heart disease Maternal Grandmother    Stroke Maternal Grandmother    Hyperlipidemia Maternal Grandmother    Breast cancer Maternal Grandmother    Breast cancer Mother    Hyperlipidemia Mother    Hypertension Sister    Hyperlipidemia Sister    Other Sister        thyroid issue   Hyperlipidemia Maternal Grandfather    Diabetes Paternal Grandmother     Heart attack Paternal Grandmother    Breast cancer Paternal Grandmother    Allergic rhinitis Son      Review of Systems  Constitutional: Negative.  Negative for chills and fever.  HENT: Negative.  Negative for congestion and sore throat.   Respiratory: Negative.  Negative for cough and shortness of breath.   Cardiovascular: Negative.  Negative for chest pain and palpitations.  Gastrointestinal:  Negative for abdominal pain, diarrhea, nausea and vomiting.  Genitourinary: Negative.   Skin: Negative.  Negative for rash.  All other systems reviewed and are negative.   Physical Exam Vitals reviewed.  Constitutional:      Appearance: Normal appearance.  HENT:     Head: Normocephalic.  Eyes:     Extraocular Movements: Extraocular movements intact.     Conjunctiva/sclera: Conjunctivae normal.     Pupils: Pupils are equal, round, and reactive to light.  Cardiovascular:     Rate and Rhythm: Normal rate and regular rhythm.     Pulses: Normal pulses.     Heart sounds: Normal heart sounds.  Pulmonary:     Effort: Pulmonary effort is normal.     Breath sounds: Normal breath sounds.  Musculoskeletal:        General: Normal range of motion.     Cervical back: Normal range of motion and neck supple. No tenderness.  Lymphadenopathy:     Cervical: No cervical adenopathy.  Skin:    General: Skin is warm and dry.     Capillary Refill: Capillary refill takes less than 2 seconds.  Neurological:     General: No focal deficit present.     Mental Status: She is alert and oriented to person, place, and time.  Psychiatric:        Mood and Affect: Mood normal.        Behavior: Behavior normal.     ASSESSMENT & PLAN: Problem List Items Addressed This Visit       Endocrine   Hypothyroidism - Primary    Clinically euthyroid.  Continue Synthroid 25 mcg daily. TSH test done today.  Will adjust accordingly.      Relevant Orders   CBC with Differential/Platelet   Comprehensive metabolic  panel   TSH   Hemoglobin A1c   Lipid panel   Teratoma of left ovary    Sees gynecologist on a regular basis.  Stable.        Musculoskeletal and Integument   Vitiligo    Chronic autoimmune condition.  Stable.  Other   History of gestational diabetes    Hemoglobin A1c done today.  Diet and nutrition discussed.      Relevant Orders   TSH   Other Visit Diagnoses     Need for influenza vaccination       Relevant Orders   Flu Vaccine QUAD 102mo+IM (Fluarix, Fluzone & Alfiuria Quad PF) (Completed)   Encounter to establish care          Patient Instructions  Health Maintenance, Female Adopting a healthy lifestyle and getting preventive care are important in promoting health and wellness. Ask your health care provider about: The right schedule for you to have regular tests and exams. Things you can do on your own to prevent diseases and keep yourself healthy. What should I know about diet, weight, and exercise? Eat a healthy diet  Eat a diet that includes plenty of vegetables, fruits, low-fat dairy products, and lean protein. Do not eat a lot of foods that are high in solid fats, added sugars, or sodium. Maintain a healthy weight Body mass index (BMI) is used to identify weight problems. It estimates body fat based on height and weight. Your health care provider can help determine your BMI and help you achieve or maintain a healthy weight. Get regular exercise Get regular exercise. This is one of the most important things you can do for your health. Most adults should: Exercise for at least 150 minutes each week. The exercise should increase your heart rate and make you sweat (moderate-intensity exercise). Do strengthening exercises at least twice a week. This is in addition to the moderate-intensity exercise. Spend less time sitting. Even light physical activity can be beneficial. Watch cholesterol and blood lipids Have your blood tested for lipids and cholesterol at 42  years of age, then have this test every 5 years. Have your cholesterol levels checked more often if: Your lipid or cholesterol levels are high. You are older than 42 years of age. You are at high risk for heart disease. What should I know about cancer screening? Depending on your health history and family history, you may need to have cancer screening at various ages. This may include screening for: Breast cancer. Cervical cancer. Colorectal cancer. Skin cancer. Lung cancer. What should I know about heart disease, diabetes, and high blood pressure? Blood pressure and heart disease High blood pressure causes heart disease and increases the risk of stroke. This is more likely to develop in people who have high blood pressure readings, are of African descent, or are overweight. Have your blood pressure checked: Every 3-5 years if you are 15-78 years of age. Every year if you are 69 years old or older. Diabetes Have regular diabetes screenings. This checks your fasting blood sugar level. Have the screening done: Once every three years after age 49 if you are at a normal weight and have a low risk for diabetes. More often and at a younger age if you are overweight or have a high risk for diabetes. What should I know about preventing infection? Hepatitis B If you have a higher risk for hepatitis B, you should be screened for this virus. Talk with your health care provider to find out if you are at risk for hepatitis B infection. Hepatitis C Testing is recommended for: Everyone born from [birth year] through 1965. Anyone with known risk factors for hepatitis C. Sexually transmitted infections (STIs) Get screened for STIs, including gonorrhea and chlamydia, if: You are sexually active and are younger than 42  years of age. You are older than 42 years of age and your health care provider tells you that you are at risk for this type of infection. Your sexual activity has changed since you were last  screened, and you are at increased risk for chlamydia or gonorrhea. Ask your health care provider if you are at risk. Ask your health care provider about whether you are at high risk for HIV. Your health care provider may recommend a prescription medicine to help prevent HIV infection. If you choose to take medicine to prevent HIV, you should first get tested for HIV. You should then be tested every 3 months for as long as you are taking the medicine. Pregnancy If you are about to stop having your period (premenopausal) and you may become pregnant, seek counseling before you get pregnant. Take 400 to 800 micrograms (mcg) of folic acid every day if you become pregnant. Ask for birth control (contraception) if you want to prevent pregnancy. Osteoporosis and menopause Osteoporosis is a disease in which the bones lose minerals and strength with aging. This can result in bone fractures. If you are 48 years old or older, or if you are at risk for osteoporosis and fractures, ask your health care provider if you should: Be screened for bone loss. Take a calcium or vitamin D supplement to lower your risk of fractures. Be given hormone replacement therapy (HRT) to treat symptoms of menopause. Follow these instructions at home: Lifestyle Do not use any products that contain nicotine or tobacco, such as cigarettes, e-cigarettes, and chewing tobacco. If you need help quitting, ask your health care provider. Do not use street drugs. Do not share needles. Ask your health care provider for help if you need support or information about quitting drugs. Alcohol use Do not drink alcohol if: Your health care provider tells you not to drink. You are pregnant, may be pregnant, or are planning to become pregnant. If you drink alcohol: Limit how much you use to 0-1 drink a day. Limit intake if you are breastfeeding. Be aware of how much alcohol is in your drink. In the [place] one drink equals one 12 oz bottle of beer  (355 mL), one 5 oz glass of wine (148 mL), or one 1 oz glass of hard liquor (44 mL). General instructions Schedule regular health, dental, and eye exams. Stay current with your vaccines. Tell your health care provider if: You often feel depressed. You have ever been abused or do not feel safe at home. Summary Adopting a healthy lifestyle and getting preventive care are important in promoting health and wellness. Follow your health care provider's instructions about healthy diet, exercising, and getting tested or screened for diseases. Follow your health care provider's instructions on monitoring your cholesterol and blood pressure. This information is not intended to replace advice given to you by your health care provider. Make sure you discuss any questions you have with your health care provider. Document Revised: 03/04/2020 Document Reviewed: 12/18/2017 Elsevier Patient Education  2022 Cobb, MD [facility]

## 2020-10-06 NOTE — Assessment & Plan Note (Signed)
Chronic autoimmune condition.  Stable.

## 2020-10-06 NOTE — Patient Instructions (Signed)
Health Maintenance, Female Adopting a healthy lifestyle and getting preventive care are important in promoting health and wellness. Ask your health care provider about: The right schedule for you to have regular tests and exams. Things you can do on your own to prevent diseases and keep yourself healthy. What should I know about diet, weight, and exercise? Eat a healthy diet  Eat a diet that includes plenty of vegetables, fruits, low-fat dairy products, and lean protein. Do not eat a lot of foods that are high in solid fats, added sugars, or sodium. Maintain a healthy weight Body mass index (BMI) is used to identify weight problems. It estimates body fat based on height and weight. Your health care provider can help determine your BMI and help you achieve or maintain a healthy weight. Get regular exercise Get regular exercise. This is one of the most important things you can do for your health. Most adults should: Exercise for at least 150 minutes each week. The exercise should increase your heart rate and make you sweat (moderate-intensity exercise). Do strengthening exercises at least twice a week. This is in addition to the moderate-intensity exercise. Spend less time sitting. Even light physical activity can be beneficial. Watch cholesterol and blood lipids Have your blood tested for lipids and cholesterol at 42 years of age, then have this test every 5 years. Have your cholesterol levels checked more often if: Your lipid or cholesterol levels are high. You are older than 42 years of age. You are at high risk for heart disease. What should I know about cancer screening? Depending on your health history and family history, you may need to have cancer screening at various ages. This may include screening for: Breast cancer. Cervical cancer. Colorectal cancer. Skin cancer. Lung cancer. What should I know about heart disease, diabetes, and high blood pressure? Blood pressure and heart  disease High blood pressure causes heart disease and increases the risk of stroke. This is more likely to develop in people who have high blood pressure readings, are of African descent, or are overweight. Have your blood pressure checked: Every 3-5 years if you are 18-39 years of age. Every year if you are 40 years old or older. Diabetes Have regular diabetes screenings. This checks your fasting blood sugar level. Have the screening done: Once every three years after age 40 if you are at a normal weight and have a low risk for diabetes. More often and at a younger age if you are overweight or have a high risk for diabetes. What should I know about preventing infection? Hepatitis B If you have a higher risk for hepatitis B, you should be screened for this virus. Talk with your health care provider to find out if you are at risk for hepatitis B infection. Hepatitis C Testing is recommended for: Everyone born from 1945 through 1965. Anyone with known risk factors for hepatitis C. Sexually transmitted infections (STIs) Get screened for STIs, including gonorrhea and chlamydia, if: You are sexually active and are younger than 42 years of age. You are older than 42 years of age and your health care provider tells you that you are at risk for this type of infection. Your sexual activity has changed since you were last screened, and you are at increased risk for chlamydia or gonorrhea. Ask your health care provider if you are at risk. Ask your health care provider about whether you are at high risk for HIV. Your health care provider may recommend a prescription medicine   to help prevent HIV infection. If you choose to take medicine to prevent HIV, you should first get tested for HIV. You should then be tested every 3 months for as long as you are taking the medicine. Pregnancy If you are about to stop having your period (premenopausal) and you may become pregnant, seek counseling before you get  pregnant. Take 400 to 800 micrograms (mcg) of folic acid every day if you become pregnant. Ask for birth control (contraception) if you want to prevent pregnancy. Osteoporosis and menopause Osteoporosis is a disease in which the bones lose minerals and strength with aging. This can result in bone fractures. If you are 65 years old or older, or if you are at risk for osteoporosis and fractures, ask your health care provider if you should: Be screened for bone loss. Take a calcium or vitamin D supplement to lower your risk of fractures. Be given hormone replacement therapy (HRT) to treat symptoms of menopause. Follow these instructions at home: Lifestyle Do not use any products that contain nicotine or tobacco, such as cigarettes, e-cigarettes, and chewing tobacco. If you need help quitting, ask your health care provider. Do not use street drugs. Do not share needles. Ask your health care provider for help if you need support or information about quitting drugs. Alcohol use Do not drink alcohol if: Your health care provider tells you not to drink. You are pregnant, may be pregnant, or are planning to become pregnant. If you drink alcohol: Limit how much you use to 0-1 drink a day. Limit intake if you are breastfeeding. Be aware of how much alcohol is in your drink. In the [place] one drink equals one 12 oz bottle of beer (355 mL), one 5 oz glass of wine (148 mL), or one 1 oz glass of hard liquor (44 mL). General instructions Schedule regular health, dental, and eye exams. Stay current with your vaccines. Tell your health care provider if: You often feel depressed. You have ever been abused or do not feel safe at home. Summary Adopting a healthy lifestyle and getting preventive care are important in promoting health and wellness. Follow your health care provider's instructions about healthy diet, exercising, and getting tested or screened for diseases. Follow your health care provider's  instructions on monitoring your cholesterol and blood pressure. This information is not intended to replace advice given to you by your health care provider. Make sure you discuss any questions you have with your health care provider. Document Revised: 03/04/2020 Document Reviewed: 12/18/2017 Elsevier Patient Education  2022 [employer]  

## 2020-10-06 NOTE — Assessment & Plan Note (Signed)
Clinically euthyroid.  Continue Synthroid 25 mcg daily. TSH test done today.  Will adjust accordingly.

## 2020-11-16 ENCOUNTER — Encounter: Payer: Self-pay | Admitting: Nurse Practitioner

## 2020-11-16 ENCOUNTER — Other Ambulatory Visit: Payer: Self-pay

## 2020-11-16 ENCOUNTER — Ambulatory Visit: Payer: 59 | Admitting: Nurse Practitioner

## 2020-11-16 VITALS — BP 126/72 | HR 87 | Temp 97.7°F | Resp 16 | Ht 64.0 in | Wt 141.0 lb

## 2020-11-16 DIAGNOSIS — J0140 Acute pansinusitis, unspecified: Secondary | ICD-10-CM

## 2020-11-16 MED ORDER — AMOXICILLIN-POT CLAVULANATE 875-125 MG PO TABS: 1.0000 | ORAL_TABLET | Freq: Two times a day (BID) | ORAL | 0 refills | Status: AC

## 2020-11-16 NOTE — Progress Notes (Signed)
Acute Office Visit  Subjective:    Patient ID: Hannah Best, female    DOB: 01-08-1981, 40 y.o.   MRN: [id number]  Chief Complaint  Patient presents with   Nasal Congestion    Runny nose, nasal congestion, fatigue, sneezing, sore throat-this was all about over a week ago. Ears are muffled, blowing out green phlegm now. Covid test negative today 11/16/20.    HPI Patient is in today for Nasal congestion  Symptoms started approx 1.5 weeks Covid Dy[place] and nyquill otc with some relief Has used flonase one day not sure of relief  Does have children and they play with neighborhood children.  Patient covid tested today and that was negative. She is not vaccinated against covid She is currently breast feeding Recently took pregnancy test and was negative   Past Medical History:  Diagnosis Date   Gestational diabetes    diet controlled   Herpes    Hyperlipidemia    Migraine     Past Surgical History:  Procedure Laterality Date   BREAST BIOPSY Bilateral    2 in L, 1 in R   CESAREAN SECTION N/A 08/09/2015   Procedure: CESAREAN SECTION;  Surgeon: Jerelyn Charles, MD;  Location: [place];  Service: Obstetrics;  Laterality: N/A;   COLONOSCOPY     septorhinoplasty     thrombosed hemorrhoid     TONSILLECTOMY     TONSILLECTOMY AND ADENOIDECTOMY     TURBINATE REDUCTION     WISDOM TOOTH EXTRACTION      Family History  Problem Relation Age of Onset   Diabetes Father    Hypertension Father    Allergic rhinitis Father    Hyperlipidemia Father    Cancer Maternal Aunt        breast   Breast cancer Maternal Aunt    Diabetes Paternal Aunt    Diabetes Paternal Uncle    Cancer Maternal Grandmother        breast   Hypertension Maternal Grandmother    Heart disease Maternal Grandmother    Stroke Maternal Grandmother    Hyperlipidemia Maternal Grandmother    Breast cancer Maternal Grandmother    Breast cancer Mother    Hyperlipidemia Mother    Hypertension  Sister    Hyperlipidemia Sister    Other Sister        thyroid issue   Hyperlipidemia Maternal Grandfather    Diabetes Paternal Grandmother    Heart attack Paternal Grandmother    Breast cancer Paternal Grandmother    Allergic rhinitis Son     Social History   Socioeconomic History   Marital status: Married    Spouse name: Not on file   Number of children: 2   Years of education: 14   Highest education level: Some college, no degree  Occupational History   Not on file  Tobacco Use   Smoking status: Former    Packs/day: 0.15    Years: 1.00    Pack years: 0.15    Types: Cigarettes   Smokeless tobacco: Never   Tobacco comments:    in highschool  Vaping Use   Vaping Use: Never used  Substance and Sexual Activity   Alcohol use: Yes    Alcohol/week: 1.0 standard drink    Types: 1 Standard drinks or equivalent per week    Comment: Social   Drug use: No   Sexual activity: Yes  Other Topics Concern   Not on file  Social History Narrative   Lives at home  with husband & children   Right handed   Caffeine: 1 cup daily   Social Determinants of Health   Financial Resource Strain: Not on file  Food Insecurity: Not on file  Transportation Needs: Not on file  Physical Activity: Not on file  Stress: Not on file  Social Connections: Not on file  Intimate Partner Violence: Not on file    Outpatient Medications Prior to Visit  Medication Sig Dispense Refill   acyclovir (ZOVIRAX) 400 MG tablet Take 400 mg by mouth daily as needed (cold sore).   12   Cholecalciferol 25 MCG (1000 UT) tablet Take by mouth.     Fish Oil-Cholecalciferol (FISH OIL + D3) 1200-1000 MG-UNIT CAPS Take 1 capsule by mouth daily.      levothyroxine (SYNTHROID) 75 MCG tablet Take 75 mcg by mouth daily before breakfast.     Prenatal Vit-Fe Fumarate-FA (MULTIVITAMIN-PRENATAL) 27-0.8 MG TABS tablet Take 1 tablet by mouth daily at 12 noon.     No facility-administered medications prior to visit.     Allergies  Allergen Reactions   Latex Anaphylaxis   Shea Butter Anaphylaxis   Ciprofloxacin Other (See Comments)    Muscle spasms in legs, pt states she couldn't walk   Hydrocodone Nausea And Vomiting   Hydromorphone Nausea And Vomiting   Oxycodone Nausea And Vomiting    Review of Systems  Constitutional:  Positive for appetite change and fatigue. Negative for chills and fever.  HENT:  Positive for congestion, ear pain (ear fullness), sinus pressure and sinus pain. Negative for sore throat.   Respiratory:  Negative for cough and shortness of breath.   Cardiovascular:  Negative for chest pain.  Gastrointestinal:  Negative for diarrhea, nausea and vomiting.  Musculoskeletal:  Negative for arthralgias and myalgias.      Objective:    Physical Exam Vitals and nursing note reviewed.  Constitutional:      Appearance: Normal appearance.  HENT:     Right Ear: Tympanic membrane, ear canal and external ear normal. There is no impacted cerumen.     Left Ear: Tympanic membrane, ear canal and external ear normal. There is no impacted cerumen.     Nose:     Right Sinus: Maxillary sinus tenderness and frontal sinus tenderness present.     Left Sinus: Maxillary sinus tenderness and frontal sinus tenderness present.     Mouth/Throat:     Mouth: Mucous membranes are moist.     Pharynx: Oropharynx is clear.  Eyes:     Extraocular Movements: Extraocular movements intact.     Pupils: Pupils are equal, round, and reactive to light.  Neck:     Thyroid: No thyroid mass, thyromegaly or thyroid tenderness.  Cardiovascular:     Rate and Rhythm: Normal rate and regular rhythm.  Pulmonary:     Effort: Pulmonary effort is normal.     Breath sounds: Normal breath sounds.  Abdominal:     General: Bowel sounds are normal.  Lymphadenopathy:     Cervical: No cervical adenopathy.  Skin:    General: Skin is warm.  Neurological:     Mental Status: She is alert.  Psychiatric:        Mood and  Affect: Mood normal.        Behavior: Behavior normal.        Thought Content: Thought content normal.        Judgment: Judgment normal.    BP 126/72   Pulse 87   Temp 97.7 F (36.5  C)   Resp 16   Ht 5\' 4"  (1.626 m)   Wt 141 lb (64 kg)   SpO2 97%   BMI 24.20 kg/m  Wt Readings from Last 3 Encounters:  11/16/20 141 lb (64 kg)  10/06/20 144 lb (65.3 kg)  10/19/19 155 lb 6.4 oz (70.5 kg)    Health Maintenance Due  Topic Date Due   COVID-19 Vaccine (1) Never done   Pneumococcal Vaccine 69-45 Years old (1 - PCV) Never done   FOOT EXAM  Never done   OPHTHALMOLOGY EXAM  Never done   URINE MICROALBUMIN  Never done   PAP SMEAR-Modifier  01/20/2017    There are no preventive care reminders to display for this patient.   Lab Results  Component Value Date   TSH 5.11 10/06/2020   Lab Results  Component Value Date   WBC 6.2 10/06/2020   HGB 13.3 10/06/2020   HCT 38.9 10/06/2020   MCV 89.0 10/06/2020   PLT 205.0 10/06/2020   Lab Results  Component Value Date   NA 140 10/06/2020   K 3.8 10/06/2020   CO2 29 10/06/2020   GLUCOSE 83 10/06/2020   BUN 18 10/06/2020   CREATININE 0.74 10/06/2020   BILITOT 0.4 10/06/2020   ALKPHOS 77 10/06/2020   AST 21 10/06/2020   ALT 12 10/06/2020   PROT 7.6 10/06/2020   ALBUMIN 4.5 10/06/2020   CALCIUM 9.4 10/06/2020   ANIONGAP 8 08/08/2015   GFR 100.23 10/06/2020   Lab Results  Component Value Date   CHOL 243 (H) 10/06/2020   Lab Results  Component Value Date   HDL 75.60 10/06/2020   Lab Results  Component Value Date   LDLCALC 142 (H) 10/06/2020   Lab Results  Component Value Date   TRIG 127.0 10/06/2020   Lab Results  Component Value Date   CHOLHDL 3 10/06/2020   Lab Results  Component Value Date   HGBA1C 5.5 10/06/2020       Assessment & Plan:   Problem List Items Addressed This Visit       Respiratory   Acute non-recurrent pansinusitis - Primary    Given length of symptoms and progression of symptoms  will treat for acute sinusitis.  Discussed treatment options and settled on Augmentin with patient.  Did discuss breast-feeding and pregnancy precautions with patient.  She acknowledged understood follow-up if symptoms fail to improve or worsen. Start Augmentin 875-125 mg twice daily for 7 days.      Relevant Medications   amoxicillin-clavulanate (AUGMENTIN) 875-125 MG tablet     No orders of the defined types were placed in this encounter.  This visit occurred during the SARS-CoV-2 public health emergency.  Safety protocols were in place, including screening questions prior to the visit, additional usage of staff PPE, and extensive cleaning of exam room while observing appropriate contact time as indicated for disinfecting solutions.   Romilda Garret, NP

## 2020-11-16 NOTE — Assessment & Plan Note (Signed)
Given length of symptoms and progression of symptoms will treat for acute sinusitis.  Discussed treatment options and settled on Augmentin with patient.  Did discuss breast-feeding and pregnancy precautions with patient.  She acknowledged understood follow-up if symptoms fail to improve or worsen. Start Augmentin 875-125 mg twice daily for 7 days.

## 2020-11-16 NOTE — Patient Instructions (Signed)
Nice to see you This should take care of the issue. Follow up if you do not improve or symptoms improve

## 2021-02-10 ENCOUNTER — Telehealth: Payer: Self-pay | Admitting: Emergency Medicine

## 2021-02-10 NOTE — Telephone Encounter (Signed)
1.Medication Requested: levothyroxine (SYNTHROID) 75 MCG tablet  2. Pharmacy (Name, [place]): [facility], [place]  Phone:  [phone] Fax:  [phone]   3. On Med List: yes  4. Last Visit with PCP: 09.29.22  5. Next visit date with PCP: n/a   Agent: Please be advised that RX refills may take up to 3 business days. We ask that you follow-up with your pharmacy.

## 2021-02-13 MED ORDER — LEVOTHYROXINE SODIUM 75 MCG PO TABS: 75.0000 ug | ORAL_TABLET | Freq: Every day | ORAL | 0 refills | Status: DC

## 2021-02-13 NOTE — Telephone Encounter (Signed)
Refilled medication

## 2021-02-23 ENCOUNTER — Other Ambulatory Visit: Payer: Self-pay | Admitting: Emergency Medicine

## 2021-02-23 MED ORDER — LEVOTHYROXINE SODIUM 75 MCG PO TABS: 75.0000 ug | ORAL_TABLET | Freq: Every day | ORAL | 0 refills | Status: DC

## 2021-02-23 NOTE — Telephone Encounter (Signed)
Pt states levothyroxine (SYNTHROID) 75 MCG tablet  should be  levothyroxine (SYNTHROID) 25 MCG tablet  Pt requesting a new rx sent to [facility], [place]

## 2021-02-23 NOTE — Telephone Encounter (Signed)
Please advise. Office notes state she should be taking 25 mcg, but medication history and list states 75 mcg.

## 2021-02-23 NOTE — Telephone Encounter (Signed)
The correct dose is 75 mcg of Synthroid.  New prescription sent to pharmacy of record.

## 2021-02-24 NOTE — Telephone Encounter (Signed)
Called and notified the patient that she should be taking the 75 mcg of synthroid, not 20 mcg, and that the prescription is at her pharmacy. Patient was still hesitant on the dosage change, but stated that's what she will do.

## 2021-02-28 ENCOUNTER — Telehealth: Payer: Self-pay | Admitting: Emergency Medicine

## 2021-02-28 NOTE — Telephone Encounter (Signed)
Patient calling in  Patient says she has been using one of the brands of [place] cleaning products that was listed on the recall list  Been feeling sick since before christmas (diarrhea/congestion)  Wants to know if she needs to be seen to be tested for anything related to this recall  Please call [phone]

## 2021-03-01 NOTE — Telephone Encounter (Signed)
Callled pt to schedule an OV to discuss concerns.

## 2021-03-14 ENCOUNTER — Other Ambulatory Visit: Payer: Self-pay

## 2021-03-14 ENCOUNTER — Encounter: Payer: Self-pay | Admitting: Emergency Medicine

## 2021-03-14 ENCOUNTER — Ambulatory Visit (INDEPENDENT_AMBULATORY_CARE_PROVIDER_SITE_OTHER): Payer: Managed Care, Other (non HMO) | Admitting: Emergency Medicine

## 2021-03-14 VITALS — BP 102/68 | HR 83 | Ht 64.0 in | Wt 141.0 lb

## 2021-03-14 DIAGNOSIS — E039 Hypothyroidism, unspecified: Secondary | ICD-10-CM

## 2021-03-14 LAB — COMPREHENSIVE METABOLIC PANEL
ALT: 10 U/L (ref 0–35)
AST: 18 U/L (ref 0–37)
Albumin: 4.5 g/dL (ref 3.5–5.2)
Alkaline Phosphatase: 71 U/L (ref 39–117)
BUN: 14 mg/dL (ref 6–23)
CO2: 31 mEq/L (ref 19–32)
Calcium: 9.4 mg/dL (ref 8.4–10.5)
Chloride: 102 mEq/L (ref 96–112)
Creatinine, Ser: 0.86 mg/dL (ref 0.40–1.20)
GFR: 83.44 mL/min (ref >60.00)
Glucose, Bld: 74 mg/dL (ref 70–99)
Potassium: 4.2 mEq/L (ref 3.5–5.1)
Sodium: 139 mEq/L (ref 135–145)
Total Bilirubin: 0.6 mg/dL (ref 0.2–1.2)
Total Protein: 7.4 g/dL (ref 6.0–8.3)

## 2021-03-14 LAB — HEMOGLOBIN A1C: Hgb A1c MFr Bld: 5.4 % (ref 4.6–6.5)

## 2021-03-14 NOTE — Patient Instructions (Signed)

## 2021-03-14 NOTE — Assessment & Plan Note (Signed)
Clinically euthyroid.  Presently taking 75 mcg of Synthroid. ?Patient states she was taking 25 mcg before. ?We will check thyroid profile today and adjust dose accordingly. ?

## 2021-03-14 NOTE — Progress Notes (Signed)
Hannah Best [place] 41 y.o.   Chief Complaint  Patient presents with   Hypothyroidism    F/u    HISTORY OF PRESENT ILLNESS: This is a 41 y.o. female with history of hypothyroidism presently on Synthroid 75 mcg daily here for follow-up. Has no complaints or medical concerns today.  HPI   Prior to Admission medications   Medication Sig Start Date End Date Taking? Authorizing Provider  acyclovir (ZOVIRAX) 400 MG tablet Take 400 mg by mouth daily as needed (cold sore).  07/25/15  Yes [provider]  Cholecalciferol 25 MCG (1000 UT) tablet Take by mouth.   Yes [provider]  Fish Oil-Cholecalciferol (FISH OIL + D3) 1200-1000 MG-UNIT CAPS Take 1 capsule by mouth daily.    Yes [provider]  levothyroxine (SYNTHROID) 75 MCG tablet Take 1 tablet (75 mcg total) by mouth daily before breakfast. 02/23/21  Yes Neomia Herbel, Ines Bloomer, MD  Prenatal Vit-Fe Fumarate-FA (MULTIVITAMIN-PRENATAL) 27-0.8 MG TABS tablet Take 1 tablet by mouth daily at 12 noon.   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   History of gestational diabetes 10/06/2020   Hypothyroidism 10/19/2019    Past Medical History:  Diagnosis Date   Gestational diabetes    diet controlled   Herpes    Hyperlipidemia    Migraine     Past Surgical History:  Procedure Laterality Date   BREAST BIOPSY Bilateral    2 in L, 1 in R   CESAREAN SECTION N/A 08/09/2015   Procedure: CESAREAN SECTION;  Surgeon: Jerelyn Charles, MD;  Location: [place];  Service: Obstetrics;   Laterality: N/A;   COLONOSCOPY     septorhinoplasty     thrombosed hemorrhoid     TONSILLECTOMY     TONSILLECTOMY AND ADENOIDECTOMY     TURBINATE REDUCTION     WISDOM TOOTH EXTRACTION      Social History   Socioeconomic History   Marital status: Married    Spouse name: Not on file   Number of children: 2   Years of education: 14   Highest education level: Some college, no degree  Occupational History   Not on file  Tobacco Use   Smoking status: Former    Packs/day: 0.15    Years: 1.00    Pack years: 0.15    Types: Cigarettes   Smokeless tobacco: Never   Tobacco comments:    in highschool  Vaping Use   Vaping Use: Never used  Substance and Sexual Activity   Alcohol use: Yes    Alcohol/week: 1.0 standard drink    Types: 1 Standard drinks or equivalent per week    Comment: Social   Drug use: No   Sexual activity: Yes  Other Topics Concern   Not on file  Social History Narrative   Lives at home with husband & children   Right handed   Caffeine: 1 cup daily   Social Determinants of Health   Financial Resource Strain: Not on file  Food Insecurity: Not on file  Transportation Needs: Not on  file  Physical Activity: Not on file  Stress: Not on file  Social Connections: Not on file  Intimate Partner Violence: Not on file    Family History  Problem Relation Age of Onset   Diabetes Father    Hypertension Father    Allergic rhinitis Father    Hyperlipidemia Father    Cancer Maternal Aunt        breast   Breast cancer Maternal Aunt    Diabetes Paternal Aunt    Diabetes Paternal Uncle    Cancer Maternal Grandmother        breast   Hypertension Maternal Grandmother    Heart disease Maternal Grandmother    Stroke Maternal Grandmother    Hyperlipidemia Maternal Grandmother    Breast cancer Maternal Grandmother    Breast cancer Mother    Hyperlipidemia Mother    Hypertension Sister    Hyperlipidemia Sister    Other Sister        thyroid issue    Hyperlipidemia Maternal Grandfather    Diabetes Paternal Grandmother    Heart attack Paternal Grandmother    Breast cancer Paternal Grandmother    Allergic rhinitis Son      Review of Systems  Constitutional: Negative.  Negative for chills and fever.  HENT: Negative.  Negative for congestion and sore throat.   Respiratory: Negative.  Negative for cough and shortness of breath.   Cardiovascular: Negative.  Negative for chest pain and palpitations.  Gastrointestinal:  Negative for abdominal pain, nausea and vomiting.  Genitourinary: Negative.   Skin: Negative.  Negative for rash.  Neurological: Negative.  Negative for dizziness and headaches.  All other systems reviewed and are negative.  Today's Vitals   03/14/21 1037  BP: 102/68  Pulse: 83  SpO2: 97%  Weight: 141 lb (64 kg)  Height: '5\' 4"'$  (1.626 m)   Body mass index is 24.2 kg/m.  Physical Exam Vitals reviewed.  Constitutional:      Appearance: Normal appearance.  HENT:     Head: Normocephalic.  Eyes:     Extraocular Movements: Extraocular movements intact.     Pupils: Pupils are equal, round, and reactive to light.  Cardiovascular:     Rate and Rhythm: Normal rate and regular rhythm.     Pulses: Normal pulses.     Heart sounds: Normal heart sounds.  Pulmonary:     Effort: Pulmonary effort is normal.     Breath sounds: Normal breath sounds.  Musculoskeletal:        General: Normal range of motion.     Cervical back: No tenderness.  Lymphadenopathy:     Cervical: No cervical adenopathy.  Skin:    General: Skin is warm and dry.  Neurological:     General: No focal deficit present.     Mental Status: She is alert and oriented to person, place, and time.  Psychiatric:        Mood and Affect: Mood normal.        Behavior: Behavior normal.     ASSESSMENT & PLAN: A total of 30 minutes was spent with the patient and counseling/coordination of care regarding preparing for this visit, review of most recent office  visit notes, review of all medications, review of most recent blood work results, need for thyroid profile today, education on nutrition, prognosis, documentation and need for follow-up.  Problem List Items Addressed This Visit       Endocrine   Hypothyroidism - Primary    Clinically euthyroid.  Presently  taking 75 mcg of Synthroid. Patient states she was taking 25 mcg before. We will check thyroid profile today and adjust dose accordingly.      Relevant Orders   Hemoglobin A1c   Comprehensive metabolic panel   Thyroid Panel With TSH   Patient Instructions  Hypothyroidism Hypothyroidism is when the thyroid gland does not make enough of certain hormones (it is underactive). The thyroid gland is a small gland located in the lower front part of the neck, just in front of the windpipe (trachea). This gland makes hormones that help control how the body uses food for energy (metabolism) as well as how the heart and brain function. These hormones also play a role in keeping your bones strong. When the thyroid is underactive, it produces too little of the hormones thyroxine (T4) and triiodothyronine (T3). What are the causes? This condition may be caused by: Hashimoto's disease. This is a disease in which the body's disease-fighting system (immune system) attacks the thyroid gland. This is the most common cause. Viral infections. Pregnancy. Certain medicines. Birth defects. Past radiation treatments to the head or neck for cancer. Past treatment with radioactive iodine. Past exposure to radiation in the environment. Past surgical removal of part or all of the thyroid. Problems with a gland in the center of the brain (pituitary gland). Lack of enough iodine in the diet. What increases the risk? You are more likely to develop this condition if: You are female. You have a family history of thyroid conditions. You use a medicine called lithium. You take medicines that affect the immune  system (immunosuppressants). What are the signs or symptoms? Symptoms of this condition include: Feeling as though you have no energy (lethargy). Not being able to tolerate cold. Weight gain that is not explained by a change in diet or exercise habits. Lack of appetite. Dry skin. Coarse hair. Menstrual irregularity. Slowing of thought processes. Constipation. Sadness or depression. How is this diagnosed? This condition may be diagnosed based on: Your symptoms, your medical history, and a physical exam. Blood tests. You may also have imaging tests, such as an ultrasound or MRI. How is this treated? This condition is treated with medicine that replaces the thyroid hormones that your body does not make. After you begin treatment, it may take several weeks for symptoms to go away. Follow these instructions at home: Take over-the-counter and prescription medicines only as told by your health care provider. If you start taking any new medicines, tell your health care provider. Keep all follow-up visits as told by your health care provider. This is important. As your condition improves, your dosage of thyroid hormone medicine may change. You will need to have blood tests regularly so that your health care provider can monitor your condition. Contact a health care provider if: Your symptoms do not get better with treatment. You are taking thyroid hormone replacement medicine and you: Sweat a lot. Have tremors. Feel anxious. Lose weight rapidly. Cannot tolerate heat. Have emotional swings. Have diarrhea. Feel weak. Get help right away if you have: Chest pain. An irregular heartbeat. A rapid heartbeat. Difficulty breathing. Summary Hypothyroidism is when the thyroid gland does not make enough of certain hormones (it is underactive). When the thyroid is underactive, it produces too little of the hormones thyroxine (T4) and triiodothyronine (T3). The most common cause is Hashimoto's  disease, a disease in which the body's disease-fighting system (immune system) attacks the thyroid gland. The condition can also be caused by viral infections, medicine, pregnancy,  or past radiation treatment to the head or neck. Symptoms may include weight gain, dry skin, constipation, feeling as though you do not have energy, and not being able to tolerate cold. This condition is treated with medicine to replace the thyroid hormones that your body does not make. This information is not intended to replace advice given to you by your health care provider. Make sure you discuss any questions you have with your health care provider. Document Revised: 09/01/2020 Document Reviewed: 09/10/2019 Elsevier Patient Education  2022 [place], MD [facility]

## 2021-03-15 LAB — THYROID PANEL WITH TSH
Free Thyroxine Index: 2.7 (ref 1.4–3.8)
T3 Uptake: 33 % (ref 22–35)
T4, Total: 8.1 ug/dL (ref 5.1–11.9)
TSH: 1.84 mIU/L

## 2021-04-12 ENCOUNTER — Ambulatory Visit (INDEPENDENT_AMBULATORY_CARE_PROVIDER_SITE_OTHER): Payer: Managed Care, Other (non HMO) | Admitting: Family Medicine

## 2021-04-12 ENCOUNTER — Encounter: Payer: Self-pay | Admitting: Family Medicine

## 2021-04-12 VITALS — BP 126/80 | HR 80 | Temp 97.8°F | Ht 64.0 in | Wt 136.0 lb

## 2021-04-12 DIAGNOSIS — R221 Localized swelling, mass and lump, neck: Secondary | ICD-10-CM

## 2021-04-12 NOTE — Patient Instructions (Addendum)
Pay close attention to when you have pain and tenderness of the area.  ? ?Keep a diary of your symptoms and what you were doing prior and any associated symptoms.  ?

## 2021-04-12 NOTE — Progress Notes (Signed)
? ?Subjective:  ? ? Patient ID: Hannah Best, female    DOB: August 29, 1980, 41 y.o.   MRN: [id number] ? ?HPI ?Chief Complaint  ?Patient presents with  ? Mass  ?  Left side of throat feels as if there is a lump, has not been sick. Has been having trouble swallowing.  ? ?Complains of a lump on the left side of her neck below the jaw line inside her throat.  Present for more than 1 year and intermittent flare ups. Denies pain or tenderness for the past 2 days. States the pain was over the weekend after drinking alcohol and eating spicy food. She also reports difficulty swallowing 3-4 days ago but that resolved spontaneously.  ?Denies allergy or URI symptoms.  ?  ?States she saw her ENT last year for this issue. Her ENT has since retired and she has an appt scheduled for May 25 with a new ENT ? ?States she also has an appt with a dentist in the next couple of weeks.  ? ?Hx of tonsillectomy and wisdom teeth.  ? ?Denies fever, chills, night sweats, unexplained weight loss, dizziness, chest pain, palpitations, shortness of breath, abdominal pain, N/V/D.  ? ?Past Medical History:  ?Diagnosis Date  ? Gestational diabetes   ? diet controlled  ? Herpes   ? Hyperlipidemia   ? Migraine   ? ?Current Outpatient Medications on File Prior to Visit  ?Medication Sig Dispense Refill  ? acyclovir (ZOVIRAX) 400 MG tablet Take 400 mg by mouth daily as needed (cold sore).   12  ? Cholecalciferol 25 MCG (1000 UT) tablet Take by mouth.    ? Fish Oil-Cholecalciferol (FISH OIL + D3) 1200-1000 MG-UNIT CAPS Take 1 capsule by mouth daily.     ? levothyroxine (SYNTHROID) 75 MCG tablet Take 1 tablet (75 mcg total) by mouth daily before breakfast. 90 tablet 0  ? Prenatal Vit-Fe Fumarate-FA (MULTIVITAMIN-PRENATAL) 27-0.8 MG TABS tablet Take 1 tablet by mouth daily at 12 noon.    ? ?No current facility-administered medications on file prior to visit.  ? ? ? ? ? ? ?Review of Systems ?Pertinent positives and negatives in the history of present  illness. ? ? ?   ?Objective:  ? Physical Exam ?Constitutional:   ?   General: She is not in acute distress. ?   Appearance: Normal appearance. She is not ill-appearing.  ?HENT:  ?   Head: Normocephalic and atraumatic.  ?   Mouth/Throat:  ?   Lips: Pink.  ?   Mouth: Mucous membranes are moist.  ?   Tongue: No lesions.  ?   Palate: No lesions.  ?   Pharynx: Uvula midline. No pharyngeal swelling, oropharyngeal exudate or uvula swelling.  ?Eyes:  ?   Extraocular Movements: Extraocular movements intact.  ?   Conjunctiva/sclera: Conjunctivae normal.  ?   Pupils: Pupils are equal, round, and reactive to light.  ?Neck:  ?   Thyroid: No thyromegaly or thyroid tenderness.  ?   Trachea: Trachea and phonation normal.  ?Cardiovascular:  ?   Rate and Rhythm: Normal rate.  ?Pulmonary:  ?   Effort: Pulmonary effort is normal.  ?Musculoskeletal:  ?   Cervical back: Normal range of motion and neck supple.  ?Lymphadenopathy:  ?   Cervical: No cervical adenopathy.  ?Skin: ?   General: Skin is warm and dry.  ?Neurological:  ?   Mental Status: She is alert.  ? ?BP 126/80 (BP Location: Left Arm, Patient Position: Sitting, Cuff Size:  Large)   Pulse 80   Temp 97.8 ?F (36.6 ?C) (Temporal)   Ht '5\' 4"'$  (1.626 m)   Wt 136 lb (61.7 kg)   SpO2 98%   BMI 23.34 kg/m?  ? ? ?   ?Assessment & Plan:  ?Mass of left side of neck ? ?Reviewed negative neck CTA head and neck in 2019. Discussed that there is no obvious explanation for her symptoms today. Consider salivary gland dysfunction. No sign of infection, lymphadenopathy, or any other worrisome symptoms. Encouraged her to keep a diary of her symptoms and any aggravating factors. She will let us know if she has any new or worsening symptoms. Keep appointments with dentist and ENT for further evaluation.  ? ?

## 2021-05-29 ENCOUNTER — Telehealth: Payer: Self-pay | Admitting: Emergency Medicine

## 2021-05-29 DIAGNOSIS — T7840XA Allergy, unspecified, initial encounter: Secondary | ICD-10-CM

## 2021-05-29 NOTE — Telephone Encounter (Signed)
PT calls today in regards to ongoing symptoms. PT is has been dealing with what they believe is an allergic reaction for the past week, hives on face, stomach, legs. They are still experiencing these symptoms and are now having some mouth and tongue swelling. PT has had an allergic reaction before and states that she will head to the hospital if needed. States benadryl is helping.  PT wanted to try and get the ball rolling on getting a referral to an allergist done if possible/needed. She said she was a fairly new patient of Dr.Sagardia's and had not gotten to express these previous bouts of allergy issues.   CB: [phone]

## 2021-05-30 NOTE — Telephone Encounter (Signed)
Recommend to be seen at the urgent care center today.

## 2021-05-30 NOTE — Telephone Encounter (Signed)
Okay to place referral.  Thanks.

## 2021-05-30 NOTE — Telephone Encounter (Signed)
Referral to allergist placed. Pt notified

## 2021-06-01 DIAGNOSIS — J029 Acute pharyngitis, unspecified: Secondary | ICD-10-CM | POA: Insufficient documentation

## 2021-06-02 ENCOUNTER — Encounter (HOSPITAL_COMMUNITY): Payer: Self-pay | Admitting: Emergency Medicine

## 2021-06-02 ENCOUNTER — Ambulatory Visit (HOSPITAL_COMMUNITY)
Admission: EM | Admit: 2021-06-02 | Discharge: 2021-06-02 | Disposition: A | Discharge status: Home / Self Care | Payer: Managed Care, Other (non HMO) | Attending: Family Medicine | Admitting: Family Medicine

## 2021-06-02 DIAGNOSIS — T7840XA Allergy, unspecified, initial encounter: Secondary | ICD-10-CM | POA: Diagnosis not present

## 2021-06-02 HISTORY — DX: Disorder of thyroid, unspecified: E07.9

## 2021-06-02 MED ORDER — METHYLPREDNISOLONE SODIUM SUCC 125 MG IJ SOLR
INTRAMUSCULAR | Status: AC
  Filled 2021-06-02: qty 2

## 2021-06-02 MED ORDER — METHYLPREDNISOLONE SODIUM SUCC 125 MG IJ SOLR
125.0000 mg | Freq: Once | INTRAMUSCULAR | Status: AC
  Administered 2021-06-02: 125 mg via INTRAMUSCULAR

## 2021-06-02 MED ORDER — FAMOTIDINE 20 MG PO TABS
20.0000 mg | ORAL_TABLET | Freq: Once | ORAL | Status: AC
  Administered 2021-06-02: 20 mg via ORAL

## 2021-06-02 MED ORDER — FAMOTIDINE 20 MG PO TABS: 20.0000 mg | ORAL_TABLET | Freq: Two times a day (BID) | ORAL | 0 refills | Status: DC

## 2021-06-02 MED ORDER — FAMOTIDINE 20 MG PO TABS
ORAL_TABLET | ORAL | Status: AC
  Filled 2021-06-02: qty 1

## 2021-06-02 MED ORDER — EPINEPHRINE 0.3 MG/0.3ML IJ SOAJ: 0.3000 mg | INTRAMUSCULAR | 0 refills | Status: AC | PRN

## 2021-06-02 MED ORDER — PREDNISONE 20 MG PO TABS: ORAL_TABLET | ORAL | 0 refills | Status: DC

## 2021-06-02 NOTE — Discharge Instructions (Addendum)
You have been given a shot of methylprednisolone 125 mg today, and an oral dose of famotidine 20 mg.  Take prednisone 20 mg--3 tabs daily x3 days, then 2 tabs daily x3 days, then 1 tab daily x3 days, then one half tab daily x3 days, then stop   Take famotidine 20 mg--1 tab 2 times daily for about 5 days.  EpiPen is also sent in on a prescription for you to have on hand

## 2021-06-02 NOTE — ED Triage Notes (Signed)
Pt reports had hives all week. Reports that this morning her lips and tongue swelling this morning. Took one Benadryl this morning. Reports that this happened years ago and new needed to be seen

## 2021-06-02 NOTE — ED Provider Notes (Signed)
[facility]    CSN: [id number] Arrival date & time: 06/02/21  0841      History   Chief Complaint Chief Complaint  Patient presents with   Allergic Reaction    HPI Hannah Best is a 41 y.o. female.    Allergic Reaction Here for swelling in her lips that was first noted this morning.  She has had hives off and on for the last 7 days.  No trouble breathing or wheezing.  Her tongue feels just a little swollen.  She has been able to drink and swallow without problem this morning her throat does not feel like it is closing.  She has had anaphylactic reaction a long time ago, possibly to laundry detergent.  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   History of gestational diabetes 10/06/2020   Hypothyroidism 10/19/2019    Past Surgical History:  Procedure Laterality Date   BREAST BIOPSY Bilateral    2 in L, 1 in R   CESAREAN SECTION N/A 08/09/2015   Procedure: CESAREAN SECTION;  Surgeon: Jerelyn Charles, MD;  Location: [facility];  Service: Obstetrics;  Laterality: N/A;   COLONOSCOPY     septorhinoplasty     thrombosed hemorrhoid     TONSILLECTOMY     TONSILLECTOMY AND ADENOIDECTOMY     TURBINATE REDUCTION     WISDOM TOOTH EXTRACTION      OB History     Gravida  4   Para  3   Term  3   Preterm      AB  1   Living  3      SAB  1   IAB      Ectopic      Multiple  0   Live Births  3            Home Medications    Prior to Admission medications   Medication Sig Start Date End Date Taking? Authorizing Provider  EPINEPHrine 0.3 mg/0.3 mL IJ SOAJ injection Inject 0.3 mg into the muscle as needed for anaphylaxis. 06/02/21  Yes Barrett Henle, MD   famotidine (PEPCID) 20 MG tablet Take 1 tablet (20 mg total) by mouth 2 (two) times daily. 06/02/21  Yes Barrett Henle, MD  predniSONE (DELTASONE) 20 MG tablet 3 tabs daily x3 days, then 2 tabs daily x3 days, then 1 tab daily x3 days, then one half tab daily x3 days, then stop 06/02/21  Yes Dalani Mette, Gwenlyn Perking, MD  acyclovir (ZOVIRAX) 400 MG tablet Take 400 mg by mouth daily as needed (cold sore).  07/25/15   [provider]  Cholecalciferol 25 MCG (1000 UT) tablet Take by mouth.    [provider]  Fish Oil-Cholecalciferol (FISH OIL + D3) 1200-1000 MG-UNIT CAPS Take 1 capsule by mouth daily.     [provider]  levothyroxine (SYNTHROID) 75 MCG tablet Take 1 tablet (75 mcg total) by mouth daily before breakfast. 02/23/21   Sagardia, Ines Bloomer, MD  Prenatal Vit-Fe Fumarate-FA (MULTIVITAMIN-PRENATAL) 27-0.8 MG TABS tablet Take 1 tablet by mouth daily at 12 noon.    [provider]    Family History  Family History  Problem Relation Age of Onset   Diabetes Father    Hypertension Father    Allergic rhinitis Father    Hyperlipidemia Father    Cancer Maternal Aunt        breast   Breast cancer Maternal Aunt    Diabetes Paternal Aunt    Diabetes Paternal Uncle    Cancer Maternal Grandmother        breast   Hypertension Maternal Grandmother    Heart disease Maternal Grandmother    Stroke Maternal Grandmother    Hyperlipidemia Maternal Grandmother    Breast cancer Maternal Grandmother    Breast cancer Mother    Hyperlipidemia Mother    Hypertension Sister    Hyperlipidemia Sister    Other Sister        thyroid issue   Hyperlipidemia Maternal Grandfather    Diabetes Paternal Grandmother    Heart attack Paternal Grandmother    Breast cancer Paternal Grandmother    Allergic rhinitis Son     Social History Social History   Tobacco Use   Smoking status: Former    Packs/day: 0.15    Years: 1.00    Pack years: 0.15    Types: Cigarettes    Smokeless tobacco: Never   Tobacco comments:    in highschool  Vaping Use   Vaping Use: Never used  Substance Use Topics   Alcohol use: Yes    Alcohol/week: 1.0 standard drink    Types: 1 Standard drinks or equivalent per week    Comment: Social   Drug use: No     Allergies   Latex, Shea butter, Ciprofloxacin, Hydrocodone, Hydromorphone, and Oxycodone   Review of Systems Review of Systems   Physical Exam Triage Vital Signs ED Triage Vitals  Enc Vitals Group     BP 06/02/21 0850 117/80     Pulse Rate 06/02/21 0850 84     Resp 06/02/21 0850 17     Temp 06/02/21 0850 98 F (36.7 C)     Temp Source 06/02/21 0850 Oral     SpO2 06/02/21 0850 98 %     Weight --      Height --      Head Circumference --      Peak Flow --      Pain Score 06/02/21 0849 0     Pain Loc --      Pain Edu? --      Excl. in [place]? --    No data found.  Updated Vital Signs BP 117/80 (BP Location: Right Arm)   Pulse 84   Temp 98 F (36.7 C) (Oral)   Resp 17   SpO2 98%   Breastfeeding Yes   Visual Acuity Right Eye Distance:   Left Eye Distance:   Bilateral Distance:    Right Eye Near:   Left Eye Near:    Bilateral Near:     Physical Exam Vitals reviewed.  Constitutional:      General: She is not in acute distress.    Appearance: She is not toxic-appearing.  HENT:     Nose: Nose normal.     Mouth/Throat:     Mouth: Mucous membranes are moist.     Pharynx: No oropharyngeal exudate or posterior oropharyngeal erythema.     Comments: Lips are somewhat edematous.  Tongue is benign, and oropharynx is not swollen Eyes:     Extraocular Movements: Extraocular movements intact.     Conjunctiva/sclera: Conjunctivae normal.  Pupils: Pupils are equal, round, and reactive to light.  Cardiovascular:     Rate and Rhythm: Normal rate and regular rhythm.     Heart sounds: No murmur heard. Pulmonary:     Effort: Pulmonary effort is normal. No respiratory distress.     Breath sounds: No  wheezing, rhonchi or rales.  Musculoskeletal:     Cervical back: Neck supple.  Lymphadenopathy:     Cervical: No cervical adenopathy.  Skin:    Capillary Refill: Capillary refill takes less than 2 seconds.     Coloration: Skin is not jaundiced or pale.  Neurological:     General: No focal deficit present.     Mental Status: She is alert and oriented to person, place, and time.  Psychiatric:        Behavior: Behavior normal.     UC Treatments / Results  Labs (all labs ordered are listed, but only abnormal results are displayed) Labs Reviewed - No data to display  EKG   Radiology No results found.  Procedures Procedures (including critical care time)  Medications Ordered in UC Medications  methylPREDNISolone sodium succinate (SOLU-MEDROL) 125 mg/2 mL injection 125 mg (125 mg Intramuscular Given 06/02/21 0906)  famotidine (PEPCID) tablet 20 mg (20 mg Oral Given 06/02/21 0907)    Initial Impression / Assessment and Plan / UC Course  I have reviewed the triage vital signs and the nursing notes.  Pertinent labs & imaging results that were available during my care of the patient were reviewed by me and considered in my medical decision making (see chart for details).     25 minutes after she was given the steroid injection and the oral Pepcid, she has less lip swelling on exam.  She also states she can move her lips more easily and they feel less stiff.  We will treat with a prednisone taper a few days of Pepcid, and also I will send in an EpiPen for her.  She states she has already been referred to allergist by her PCP. Final Clinical Impressions(s) / UC Diagnoses   Final diagnoses:  Allergic reaction, initial encounter     Discharge Instructions      You have been given a shot of methylprednisolone 125 mg today, and an oral dose of famotidine 20 mg.  Take prednisone 20 mg--3 tabs daily x3 days, then 2 tabs daily x3 days, then 1 tab daily x3 days, then one half tab  daily x3 days, then stop   Take famotidine 20 mg--1 tab 2 times daily for about 5 days.  EpiPen is also sent in on a prescription for you to have on hand     ED Prescriptions     Medication Sig Dispense Auth. Provider   predniSONE (DELTASONE) 20 MG tablet 3 tabs daily x3 days, then 2 tabs daily x3 days, then 1 tab daily x3 days, then one half tab daily x3 days, then stop 20 tablet Arrabella Westerman, Gwenlyn Perking, MD   famotidine (PEPCID) 20 MG tablet Take 1 tablet (20 mg total) by mouth 2 (two) times daily. 10 tablet Barrett Henle, MD   EPINEPHrine 0.3 mg/0.3 mL IJ SOAJ injection Inject 0.3 mg into the muscle as needed for anaphylaxis. 1 each Barrett Henle, MD      PDMP not reviewed this encounter.   Barrett Henle, MD 06/02/21 [phone]

## 2021-07-21 ENCOUNTER — Ambulatory Visit (INDEPENDENT_AMBULATORY_CARE_PROVIDER_SITE_OTHER): Payer: Managed Care, Other (non HMO) | Admitting: Allergy

## 2021-07-21 ENCOUNTER — Encounter: Payer: Self-pay | Admitting: Allergy

## 2021-07-21 VITALS — BP 120/74 | HR 78 | Temp 97.6°F | Resp 16 | Ht 64.0 in | Wt 142.5 lb

## 2021-07-21 DIAGNOSIS — H1013 Acute atopic conjunctivitis, bilateral: Secondary | ICD-10-CM | POA: Diagnosis not present

## 2021-07-21 DIAGNOSIS — Z8679 Personal history of other diseases of the circulatory system: Secondary | ICD-10-CM | POA: Insufficient documentation

## 2021-07-21 DIAGNOSIS — J310 Chronic rhinitis: Secondary | ICD-10-CM | POA: Diagnosis not present

## 2021-07-21 DIAGNOSIS — T783XXD Angioneurotic edema, subsequent encounter: Secondary | ICD-10-CM

## 2021-07-21 DIAGNOSIS — O09529 Supervision of elderly multigravida, unspecified trimester: Secondary | ICD-10-CM | POA: Insufficient documentation

## 2021-07-21 DIAGNOSIS — Q078 Other specified congenital malformations of nervous system: Secondary | ICD-10-CM | POA: Insufficient documentation

## 2021-07-21 DIAGNOSIS — L508 Other urticaria: Secondary | ICD-10-CM

## 2021-07-21 DIAGNOSIS — Z98891 History of uterine scar from previous surgery: Secondary | ICD-10-CM | POA: Insufficient documentation

## 2021-07-21 DIAGNOSIS — H109 Unspecified conjunctivitis: Secondary | ICD-10-CM

## 2021-07-21 NOTE — Progress Notes (Signed)
New Patient Note  RE: Hannah Best MRN: [id number] DOB: March 24, 1980 Date of Office Visit: 07/21/2021  Referring provider: Horald Pollen, * Primary care provider: Horald Pollen, MD  Chief Complaint: Hives  History of present illness: Hannah Best is a 41 y.o. female presenting today for evaluation of hives.  She has not had an issue with hives or swelling since her 23s but around Mother's day this year she started having swelling and hives.  She states she was having hives daily since but they did seem to stop several days after she had an UC visit on 06/02/21.  She went to the urgent care as she was having lip swelling and had become quite worrisome.  She was still able to swallow and breathe without issue. She was treated in the urgent care with Solu-Medrol as well as Pepcid by mouth. She is not sure what triggered this episode.  She has been trying to determine what could have caused this.  She states in her 46s she had a reaction that she thought was related to the detergent she was using.  Thus with this current episode she did some patch testing to the detergent she was using on her arm and states nothing happened.  She states nothing has changed with her medications.  She was in different environments throughout having the hives including her home and while on vacation.  She has not noted any foods that seem to have been a trigger or exacerbator.  Has had not left any marks or bruising behind.  No concern for bites or stings.  No preceding illness.  No history of asthma.  She had history of eczema on lower extremities as a child.   She has seasonal allergies and also reports cats, dogs and indoor bird can cause hoarseness, tightness of throat, itchy/watery eyes, nasal congestion and drainage.  She would typically take Zyrtec or Allegra for the symptoms as needed.  She has vitiligo as well as thyroid disease.    Review of systems in the past 4  weeks: Review of Systems  Constitutional: Negative.   HENT: Negative.    Eyes: Negative.   Respiratory: Negative.    Cardiovascular: Negative.   Gastrointestinal: Negative.   Musculoskeletal: Negative.   Skin: Negative.   Allergic/Immunologic: Negative.   Neurological: Negative.     All other systems negative unless noted above in HPI  Past medical history: Past Medical History:  Diagnosis Date   Gestational diabetes    diet controlled   Herpes    Hyperlipidemia    Migraine    Thyroid disease     Past surgical history: Past Surgical History:  Procedure Laterality Date   BREAST BIOPSY Bilateral    2 in L, 1 in R   CESAREAN SECTION N/A 08/09/2015   Procedure: CESAREAN SECTION;  Surgeon: Jerelyn Charles, MD;  Location: [facility];  Service: Obstetrics;  Laterality: N/A;   COLONOSCOPY     septorhinoplasty     thrombosed hemorrhoid     TONSILLECTOMY     TONSILLECTOMY AND ADENOIDECTOMY     TURBINATE REDUCTION     WISDOM TOOTH EXTRACTION      Family history:  Family History  Problem Relation Age of Onset   Diabetes Father    Hypertension Father    Allergic rhinitis Father    Hyperlipidemia Father    Cancer Maternal Aunt        breast   Breast cancer Maternal Aunt  Diabetes Paternal Aunt    Diabetes Paternal Uncle    Cancer Maternal Grandmother        breast   Hypertension Maternal Grandmother    Heart disease Maternal Grandmother    Stroke Maternal Grandmother    Hyperlipidemia Maternal Grandmother    Breast cancer Maternal Grandmother    Breast cancer Mother    Hyperlipidemia Mother    Hypertension Sister    Hyperlipidemia Sister    Other Sister        thyroid issue   Hyperlipidemia Maternal Grandfather    Diabetes Paternal Grandmother    Heart attack Paternal Grandmother    Breast cancer Paternal Grandmother    Allergic rhinitis Son     Social history: Lives in a home with carpeting in the bedroom with gas in the troponin.  Dog in the home.   There is no concern for water damage, mildew in the home.  There are roaches in the home.  She is a Haematologist.  She has no smoking history.   Medication List: Current Outpatient Medications  Medication Sig Dispense Refill   acyclovir (ZOVIRAX) 400 MG tablet Take 400 mg by mouth daily as needed (cold sore).   12   Cholecalciferol 25 MCG (1000 UT) tablet Take by mouth.     EPINEPHrine 0.3 mg/0.3 mL IJ SOAJ injection Inject 0.3 mg into the muscle as needed for anaphylaxis. 1 each 0   Fish Oil-Cholecalciferol (FISH OIL + D3) 1200-1000 MG-UNIT CAPS Take 1 capsule by mouth daily.      levothyroxine (SYNTHROID) 75 MCG tablet Take 1 tablet (75 mcg total) by mouth daily before breakfast. 90 tablet 0   Prenatal Vit-Fe Fumarate-FA (MULTIVITAMIN-PRENATAL) 27-0.8 MG TABS tablet Take 1 tablet by mouth daily at 12 noon.     famotidine (PEPCID) 20 MG tablet Take 1 tablet (20 mg total) by mouth 2 (two) times daily. (Patient not taking: Reported on 07/21/2021) 10 tablet 0   No current facility-administered medications for this visit.    Known medication allergies: Allergies  Allergen Reactions   Latex Anaphylaxis   Shea Butter Anaphylaxis   Ciprofloxacin Other (See Comments)    Muscle spasms in legs, pt states she couldn't walk   Hydrocodone Nausea And Vomiting   Hydromorphone Nausea And Vomiting   Oxycodone Nausea And Vomiting     Physical examination: Blood pressure 120/74, pulse 78, temperature 97.6 F (36.4 C), resp. rate 16, height '5\' 4"'$  (1.626 m), weight 142 lb 8 oz (64.6 kg), SpO2 98 %, currently breastfeeding.  General: Alert, interactive, in no acute distress. HEENT: PERRLA, TMs pearly gray, turbinates non-edematous without discharge, post-pharynx non erythematous. Neck: Supple without lymphadenopathy. Lungs: Clear to auscultation without wheezing, rhonchi or rales. {no increased work of breathing. CV: Normal S1, S2 without murmurs. Abdomen: Nondistended, nontender. Skin: De-pigmented  plaques on the shins bilaterally . Extremities:  No clubbing, cyanosis or edema. Neuro:   Grossly intact.  Diagnositics/Labs: None today  Assessment and plan: Urticaria with angioedema - at this time etiology of hives and swelling is unknown and most likely spontaneous.  Hives and swelling can be caused by a variety of different triggers including illness/infection, foods, medications, stings, exercise, pressure, vibrations, extremes of temperature to name a few however majority of the time there is no identifiable trigger.  You have had recurrence of hives and swelling making this chronic thus will obtain labwork to evaluate: CBC w diff, CMP, tryptase, hive panel, environmental panel, alpha-gal panel - would have ready at home  maintenance regimen of H1 and H2 blockade with Zyrtec '10mg'$ Delma Freeze '180mg'$ /Xyzal '5mg'$  tab and Pepcid '20mg'$  1 tab daily.  If daily dosing is not effective enough then increase to twice a day dosing.  If twice a day dosing is not enough then consider starting Xolair monthly injection for chronic spontaneous hives.   Rhinoconjunctivitis, presumed allergic - as above will obtain environmental allergy testing - can use above antihistamine daily as needed for symptom control - for itchy/watery eyes can use Pataday 1 drop each eye daily as needed - for nasal congestion/drainage can use nasal sprays like Flonase, Rhinocort or Nasacort  Follow-up in 4-6 months or sooner if needed    I appreciate the opportunity to take part in [facility] care. Please do not hesitate to contact me with questions.  Sincerely,   Prudy Feeler, MD Allergy/Immunology [facility]

## 2021-07-21 NOTE — Patient Instructions (Addendum)
Hives and swelling - at this time etiology of hives and swelling is unknown and most likely spontaneous.  Hives and swelling can be caused by a variety of different triggers including illness/infection, foods, medications, stings, exercise, pressure, vibrations, extremes of temperature to name a few however majority of the time there is no identifiable trigger.  You have had recurrence of hives and swelling making this chronic thus will obtain labwork to evaluate: CBC w diff, CMP, tryptase, hive panel, environmental panel, alpha-gal panel - would have ready at home maintenance regimen of H1 and H2 blockade with Zyrtec '10mg'$ Delma Freeze '180mg'$ /Xyzal '5mg'$  tab and Pepcid '20mg'$  1 tab daily.  If daily dosing is not effective enough then increase to twice a day dosing.  If twice a day dosing is not enough then consider starting Xolair monthly injection for chronic spontaneous hives.   Environmental allergy - as above will obtain environmental allergy testing - can use above antihistamine daily as needed for symptom control - for itchy/watery eyes can use Pataday 1 drop each eye daily as needed - for nasal congestion/drainage can use nasal sprays like Flonase, Rhinocort or Nasacort  Follow-up in 4-6 months or sooner if needed

## 2021-07-28 ENCOUNTER — Encounter (HOSPITAL_COMMUNITY): Payer: Self-pay | Admitting: Emergency Medicine

## 2021-07-28 ENCOUNTER — Emergency Department (HOSPITAL_COMMUNITY)
Admission: EM | Admit: 2021-07-28 | Discharge: 2021-07-28 | Discharge status: Against Medical Advice | Payer: Managed Care, Other (non HMO) | Attending: Emergency Medicine | Admitting: Emergency Medicine

## 2021-07-28 ENCOUNTER — Other Ambulatory Visit: Payer: Self-pay

## 2021-07-28 ENCOUNTER — Emergency Department (HOSPITAL_COMMUNITY): Payer: Managed Care, Other (non HMO)

## 2021-07-28 DIAGNOSIS — N83202 Unspecified ovarian cyst, left side: Secondary | ICD-10-CM | POA: Diagnosis not present

## 2021-07-28 DIAGNOSIS — Z5321 Procedure and treatment not carried out due to patient leaving prior to being seen by health care provider: Secondary | ICD-10-CM | POA: Insufficient documentation

## 2021-07-28 DIAGNOSIS — R1032 Left lower quadrant pain: Secondary | ICD-10-CM | POA: Diagnosis present

## 2021-07-28 LAB — ALPHA-GAL PANEL
Allergen Lamb IgE: <0.1 kU/L
Beef IgE: <0.1 kU/L
IgE (Immunoglobulin E), Serum: 86 IU/mL (ref 6–495)
O215-IgE Alpha-Gal: <0.1 kU/L
Pork IgE: <0.1 kU/L

## 2021-07-28 LAB — ALLERGENS W/TOTAL IGE AREA 2
Alternaria Alternata IgE: <0.1 kU/L
Aspergillus Fumigatus IgE: <0.1 kU/L
Bermuda Grass IgE: 1.04 kU/L — AB
Cat Dander IgE: 0.1 kU/L — AB
Cedar, Mountain IgE: <0.1 kU/L
Cladosporium Herbarum IgE: <0.1 kU/L
Cockroach, German IgE: <0.1 kU/L
Common Silver Birch IgE: <0.1 kU/L
Cottonwood IgE: <0.1 kU/L
D Farinae IgE: 10.9 kU/L — AB
D Pteronyssinus IgE: 5.78 kU/L — AB
Dog Dander IgE: 1.35 kU/L — AB
Elm, American IgE: <0.1 kU/L
Johnson Grass IgE: 0.5 kU/L — AB
Maple/Box Elder IgE: <0.1 kU/L
Mouse Urine IgE: <0.1 kU/L
Oak, White IgE: <0.1 kU/L
Pecan, Hickory IgE: 0.85 kU/L — AB
Penicillium Chrysogen IgE: <0.1 kU/L
Pigweed, Rough IgE: <0.1 kU/L
Ragweed, Short IgE: <0.1 kU/L
Sheep Sorrel IgE Qn: <0.1 kU/L
Timothy Grass IgE: 3.52 kU/L — AB
White Mulberry IgE: <0.1 kU/L

## 2021-07-28 LAB — CBC WITH DIFFERENTIAL
Basophils Absolute: 0 10*3/uL (ref 0.0–0.2)
Basos: 0 %
EOS (ABSOLUTE): 0.1 10*3/uL (ref 0.0–0.4)
Eos: 2 %
Hematocrit: 41.4 % (ref 34.0–46.6)
Hemoglobin: 13.6 g/dL (ref 11.1–15.9)
Immature Grans (Abs): 0 10*3/uL (ref 0.0–0.1)
Immature Granulocytes: 0 %
Lymphocytes Absolute: 1.2 10*3/uL (ref 0.7–3.1)
Lymphs: 26 %
MCH: 30.2 pg (ref 26.6–33.0)
MCHC: 32.9 g/dL (ref 31.5–35.7)
MCV: 92 fL (ref 79–97)
Monocytes Absolute: 0.3 10*3/uL (ref 0.1–0.9)
Monocytes: 8 %
Neutrophils Absolute: 2.9 10*3/uL (ref 1.4–7.0)
Neutrophils: 64 %
RBC: 4.5 x10E6/uL (ref 3.77–5.28)
RDW: 12.2 % (ref 11.7–15.4)
WBC: 4.6 10*3/uL (ref 3.4–10.8)

## 2021-07-28 LAB — COMPREHENSIVE METABOLIC PANEL
ALT: 13 U/L (ref 0–44)
AST: 18 U/L (ref 15–41)
Albumin: 4 g/dL (ref 3.5–5.0)
Alkaline Phosphatase: 71 U/L (ref 38–126)
Anion gap: 8 (ref 5–15)
BUN: 12 mg/dL (ref 6–20)
CO2: 24 mmol/L (ref 22–32)
Calcium: 8.8 mg/dL — ABNORMAL LOW (ref 8.9–10.3)
Chloride: 105 mmol/L (ref 98–111)
Creatinine, Ser: 0.68 mg/dL (ref 0.44–1.00)
GFR, Estimated: >60 mL/min (ref >60)
Glucose, Bld: 102 mg/dL — ABNORMAL HIGH (ref 70–99)
Potassium: 3.8 mmol/L (ref 3.5–5.1)
Sodium: 137 mmol/L (ref 135–145)
Total Bilirubin: 0.9 mg/dL (ref 0.3–1.2)
Total Protein: 7 g/dL (ref 6.5–8.1)

## 2021-07-28 LAB — URINALYSIS, ROUTINE W REFLEX MICROSCOPIC
Bilirubin Urine: NEGATIVE
Glucose, UA: NEGATIVE mg/dL
Ketones, ur: NEGATIVE mg/dL
Leukocytes,Ua: NEGATIVE
Nitrite: NEGATIVE
Protein, ur: NEGATIVE mg/dL
Specific Gravity, Urine: 1.005 (ref 1.005–1.030)
pH: 6 (ref 5.0–8.0)

## 2021-07-28 LAB — THYROID ANTIBODIES
Thyroglobulin Antibody: <1 IU/mL (ref 0.0–0.9)
Thyroperoxidase Ab SerPl-aCnc: 22 IU/mL (ref 0–34)

## 2021-07-28 LAB — CBC WITH DIFFERENTIAL/PLATELET
Abs Immature Granulocytes: 0.03 10*3/uL (ref 0.00–0.07)
Basophils Absolute: 0 10*3/uL (ref 0.0–0.1)
Basophils Relative: 0 %
Eosinophils Absolute: 0.2 10*3/uL (ref 0.0–0.5)
Eosinophils Relative: 2 %
HCT: 39.5 % (ref 36.0–46.0)
Hemoglobin: 13.3 g/dL (ref 12.0–15.0)
Immature Granulocytes: 0 %
Lymphocytes Relative: 10 %
Lymphs Abs: 0.9 10*3/uL (ref 0.7–4.0)
MCH: 30.6 pg (ref 26.0–34.0)
MCHC: 33.7 g/dL (ref 30.0–36.0)
MCV: 90.8 fL (ref 80.0–100.0)
Monocytes Absolute: 0.3 10*3/uL (ref 0.1–1.0)
Monocytes Relative: 4 %
Neutro Abs: 7.6 10*3/uL (ref 1.7–7.7)
Neutrophils Relative %: 84 %
Platelets: 223 10*3/uL (ref 150–400)
RBC: 4.35 MIL/uL (ref 3.87–5.11)
RDW: 11.9 % (ref 11.5–15.5)
WBC: 9 10*3/uL (ref 4.0–10.5)
nRBC: 0 % (ref 0.0–0.2)

## 2021-07-28 LAB — CHRONIC URTICARIA: cu index: 8.6 (ref <10)

## 2021-07-28 LAB — TRYPTASE: Tryptase: 6.1 ug/L (ref 2.2–13.2)

## 2021-07-28 LAB — I-STAT BETA HCG BLOOD, ED (MC, WL, AP ONLY): I-stat hCG, quantitative: <5 m[IU]/mL (ref <5)

## 2021-07-28 LAB — LIPASE, BLOOD: Lipase: 23 U/L (ref 11–51)

## 2021-07-28 NOTE — ED Triage Notes (Addendum)
Patient with ovarian cyst that has flared up in the last few days.  She has been having nausea, no vomiting.  No fevers or chills at this time.  It was sudden onset but the pain has decreased in the last day or so.

## 2021-07-28 NOTE — ED Notes (Signed)
Patient states she has to leave

## 2021-07-28 NOTE — ED Provider Triage Note (Signed)
Emergency Medicine Provider Triage Evaluation Note  Hannah Best , a 41 y.o. female  was evaluated in triage.  Pt complains of left lower quadrant abdominal pain/pelvic pain.  Patient reports that she had sudden onset of left lower quadrant/pelvic pain 2 days ago.  Patient has had consistent pain since then however pain has slightly improved from previous.  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  Review of Systems  Positive: Left lower quadrant abdominal pain, pelvic pain, nausea Negative: Fever, chills, dysuria, hematuria, urinary urgency, vaginal pain, vaginal bleeding, vaginal discharge, vomiting, diarrhea, constipation  Physical Exam  BP 119/82 (BP Location: Left Arm)   Pulse 97   Temp 98.8 F (37.1 C) (Oral)   Resp 16   LMP 07/18/2021 (Exact Date)   SpO2 95%  Gen:   Awake, no distress   Resp:  Normal effort  MSK:   Moves extremities without difficulty  Other:  Abdomen soft, nondistended, tenderness to left lower quadrant.  No guarding or rebound tenderness.  Medical Decision Making  Medically screening exam initiated at 7:25 PM.  Appropriate orders placed.  Hannah Best was informed that the remainder of the evaluation will be completed by another provider, this initial triage assessment does not replace that evaluation, and the importance of remaining in the ED until their evaluation is complete.     Loni Beckwith, [place] 07/28/21 1927

## 2021-08-18 ENCOUNTER — Other Ambulatory Visit: Payer: Self-pay | Admitting: Emergency Medicine

## 2021-11-23 ENCOUNTER — Ambulatory Visit: Payer: Managed Care, Other (non HMO) | Admitting: Allergy

## 2022-02-10 ENCOUNTER — Other Ambulatory Visit: Payer: Self-pay | Admitting: Emergency Medicine

## 2022-03-01 ENCOUNTER — Ambulatory Visit: Payer: Managed Care, Other (non HMO) | Admitting: Emergency Medicine

## 2022-03-09 ENCOUNTER — Ambulatory Visit (INDEPENDENT_AMBULATORY_CARE_PROVIDER_SITE_OTHER): Payer: Managed Care, Other (non HMO) | Admitting: Internal Medicine

## 2022-03-09 ENCOUNTER — Telehealth: Payer: Self-pay | Admitting: Emergency Medicine

## 2022-03-09 ENCOUNTER — Encounter: Payer: Self-pay | Admitting: Internal Medicine

## 2022-03-09 VITALS — BP 122/76 | HR 70 | Temp 98.2°F | Ht 64.0 in | Wt 141.0 lb

## 2022-03-09 DIAGNOSIS — J019 Acute sinusitis, unspecified: Secondary | ICD-10-CM | POA: Diagnosis not present

## 2022-03-09 MED ORDER — VALACYCLOVIR HCL 1 G PO TABS: 2000.0000 mg | ORAL_TABLET | Freq: Two times a day (BID) | ORAL | 5 refills | Status: AC

## 2022-03-09 MED ORDER — AMOXICILLIN-POT CLAVULANATE 875-125 MG PO TABS: 1.0000 | ORAL_TABLET | Freq: Two times a day (BID) | ORAL | 0 refills | Status: AC

## 2022-03-09 NOTE — Telephone Encounter (Signed)
Looks like she was on acyclovir, but since she requested Valtrex which is typically easier I did send that in.

## 2022-03-09 NOTE — Patient Instructions (Addendum)
      Medications changes include :   Augmentin twice daily for 10 day      Return if symptoms worsen or fail to improve.

## 2022-03-09 NOTE — Progress Notes (Signed)
Subjective:    Patient ID: Hannah Best, female    DOB: 1980/09/14, 42 y.o.   MRN: [id number]      HPI Hannah Best is here for  Chief Complaint  Patient presents with   mucus    Thick green mucus sitting in the back of her throat has been there since February 3rd , did have flu A and B in Jan and the mucus is still lingering from then , tried otc medications and nothing is helping     She is here for an acute visit for cold symptoms.   Her symptoms started 02/10/22.  She has been sick since then.   She initially had viral-like symptoms, but most of those got better.  For the past couple weeks she has had persistent mucus in the back of her throat that is very thick and greenish in color.  She is having a hard time clearing it.  She states nasal congestion, PND, sinus pressure, sore throat and a cough related to the drainage.  On occasion she feels lightheaded.  She has tried everything and nothing seems to be helping.  She has tried taking allergy medication, saline spray, otc cold meds, gargling salt water.       Medications and allergies reviewed with patient and updated if appropriate.  Current Outpatient Medications on File Prior to Visit  Medication Sig Dispense Refill   acyclovir (ZOVIRAX) 400 MG tablet Take 400 mg by mouth daily as needed (cold sore).   12   Cholecalciferol 25 MCG (1000 UT) tablet Take by mouth.     EPINEPHrine 0.3 mg/0.3 mL IJ SOAJ injection Inject 0.3 mg into the muscle as needed for anaphylaxis. 1 each 0   famotidine (PEPCID) 20 MG tablet Take 1 tablet (20 mg total) by mouth 2 (two) times daily. 10 tablet 0   Fish Oil-Cholecalciferol (FISH OIL + D3) 1200-1000 MG-UNIT CAPS Take 1 capsule by mouth daily.      levothyroxine (SYNTHROID) 75 MCG tablet TAKE 1 TABLET BY MOUTH EVERY DAY BEFORE BREAKFAST 90 tablet 1   Prenatal Vit-Fe Fumarate-FA (MULTIVITAMIN-PRENATAL) 27-0.8 MG TABS tablet Take 1 tablet by mouth daily at 12 noon.     No current  facility-administered medications on file prior to visit.    Review of Systems  Constitutional:  Negative for fever.  HENT:  Positive for congestion, postnasal drip, sinus pressure and sore throat. Negative for ear pain.   Respiratory:  Positive for cough (thick mucus - greenish yellow). Negative for shortness of breath and wheezing.   Neurological:  Positive for light-headedness (occ at night). Negative for headaches.       Objective:   Vitals:   03/09/22 1609  BP: 122/76  Pulse: 70  Temp: 98.2 F (36.8 C)  SpO2: 99%   BP Readings from Last 3 Encounters:  03/09/22 122/76  07/28/21 119/82  07/21/21 120/74   Wt Readings from Last 3 Encounters:  03/09/22 141 lb (64 kg)  07/21/21 142 lb 8 oz (64.6 kg)  04/12/21 136 lb (61.7 kg)   Body mass index is 24.2 kg/m.    Physical Exam Constitutional:      General: She is not in acute distress.    Appearance: Normal appearance. She is not ill-appearing.  HENT:     Head: Normocephalic and atraumatic.     Right Ear: Tympanic membrane, ear canal and external ear normal.     Left Ear: Tympanic membrane, ear canal and external ear normal.  Mouth/Throat:     Mouth: Mucous membranes are moist.     Pharynx: Oropharyngeal exudate (Visible thick yellow-green mucus in the back of her throat) present. No posterior oropharyngeal erythema.  Eyes:     Conjunctiva/sclera: Conjunctivae normal.  Cardiovascular:     Rate and Rhythm: Normal rate and regular rhythm.  Pulmonary:     Effort: Pulmonary effort is normal. No respiratory distress.     Breath sounds: Normal breath sounds. No wheezing or rales.  Musculoskeletal:     Cervical back: Neck supple. No tenderness.  Lymphadenopathy:     Cervical: No cervical adenopathy.  Skin:    General: Skin is warm and dry.  Neurological:     Mental Status: She is alert.            Assessment & Plan:    Acute sinus infection: Acute Likely bacterial  Start Augmentin 875-125 mg BID x 10  day otc cold medications Rest, fluid Call if no improvement

## 2022-03-09 NOTE — Telephone Encounter (Signed)
PT visits today post visit and forgot to add on that she was needing a refill on her valtrex. She stated she usually kept some in incase of cold sores and such.

## 2022-03-13 ENCOUNTER — Telehealth: Payer: Self-pay | Admitting: Emergency Medicine

## 2022-03-13 NOTE — Telephone Encounter (Signed)
Called patient to inform her of provider response. Patient is scheduled to see Vickie NP on 03/15/2022. Advise patient is her symptoms get worse to go to her nearest ER or UC. Patient verbalize understanding

## 2022-03-13 NOTE — Telephone Encounter (Signed)
Most likely a side effect of the antibiotic.  She probably needs reevaluation.  Thanks.

## 2022-03-13 NOTE — Telephone Encounter (Signed)
Patient called and stated that she saw Dr. Quay Burow last Friday 03/09/2022 and she was given the medication amoxicillin-clavulanate (AUGMENTIN) 875-125 MG tablet. Patient is stating that she is having issues with her stool, possibly blood in stool or patient stating that it looks like "coffee grains". Patient wants to know is this a side effect of the medication. Patient is requesting a callback at [phone].

## 2022-03-15 ENCOUNTER — Encounter: Payer: Self-pay | Admitting: Family Medicine

## 2022-03-15 ENCOUNTER — Ambulatory Visit: Payer: Managed Care, Other (non HMO) | Admitting: Family Medicine

## 2022-03-15 VITALS — BP 106/80 | HR 67 | Temp 97.6°F | Ht 64.0 in | Wt 141.0 lb

## 2022-03-15 DIAGNOSIS — R1013 Epigastric pain: Secondary | ICD-10-CM | POA: Diagnosis not present

## 2022-03-15 DIAGNOSIS — K922 Gastrointestinal hemorrhage, unspecified: Secondary | ICD-10-CM | POA: Diagnosis not present

## 2022-03-15 DIAGNOSIS — E039 Hypothyroidism, unspecified: Secondary | ICD-10-CM

## 2022-03-15 DIAGNOSIS — K219 Gastro-esophageal reflux disease without esophagitis: Secondary | ICD-10-CM

## 2022-03-15 LAB — CBC WITH DIFFERENTIAL/PLATELET
Basophils Absolute: 0.1 10*3/uL (ref 0.0–0.1)
Basophils Relative: 1 % (ref 0.0–3.0)
Eosinophils Absolute: 0.3 10*3/uL (ref 0.0–0.7)
Eosinophils Relative: 5.3 % — ABNORMAL HIGH (ref 0.0–5.0)
HCT: 40 % (ref 36.0–46.0)
Hemoglobin: 13.6 g/dL (ref 12.0–15.0)
Lymphocytes Relative: 34.3 % (ref 12.0–46.0)
Lymphs Abs: 1.9 10*3/uL (ref 0.7–4.0)
MCHC: 34.1 g/dL (ref 30.0–36.0)
MCV: 89.8 fl (ref 78.0–100.0)
Monocytes Absolute: 0.3 10*3/uL (ref 0.1–1.0)
Monocytes Relative: 6.2 % (ref 3.0–12.0)
Neutro Abs: 3 10*3/uL (ref 1.4–7.7)
Neutrophils Relative %: 53.2 % (ref 43.0–77.0)
Platelets: 237 10*3/uL (ref 150.0–400.0)
RBC: 4.45 Mil/uL (ref 3.87–5.11)
RDW: 12.3 % (ref 11.5–15.5)
WBC: 5.6 10*3/uL (ref 4.0–10.5)

## 2022-03-15 LAB — COMPREHENSIVE METABOLIC PANEL
ALT: 11 U/L (ref 0–35)
AST: 18 U/L (ref 0–37)
Albumin: 4.1 g/dL (ref 3.5–5.2)
Alkaline Phosphatase: 59 U/L (ref 39–117)
BUN: 15 mg/dL (ref 6–23)
CO2: 28 mEq/L (ref 19–32)
Calcium: 9.1 mg/dL (ref 8.4–10.5)
Chloride: 105 mEq/L (ref 96–112)
Creatinine, Ser: 0.76 mg/dL (ref 0.40–1.20)
GFR: 96.1 mL/min (ref >60.00)
Glucose, Bld: 80 mg/dL (ref 70–99)
Potassium: 4 mEq/L (ref 3.5–5.1)
Sodium: 139 mEq/L (ref 135–145)
Total Bilirubin: 0.4 mg/dL (ref 0.2–1.2)
Total Protein: 7.1 g/dL (ref 6.0–8.3)

## 2022-03-15 LAB — LIPASE: Lipase: 8 U/L — ABNORMAL LOW (ref 11.0–59.0)

## 2022-03-15 LAB — TSH: TSH: 2.18 u[IU]/mL (ref 0.35–5.50)

## 2022-03-15 MED ORDER — PANTOPRAZOLE SODIUM 40 MG PO TBEC: 40.0000 mg | DELAYED_RELEASE_TABLET | Freq: Every day | ORAL | 1 refills | Status: DC

## 2022-03-15 NOTE — Patient Instructions (Signed)
Please go downstairs for labs.   Avoid alcohol and NSAIDs (ibuprofen, Advil, motrin, Aleve, aspirin)   Take pantoprazole in the morning and Pepcid at night.   Call and schedule with your gastroenterologist. Let me know if you have any trouble getting an appointment.   If you have any more bleeding and it is significant, fever, chills, worsening pain, dizziness, chest pain, shortness of breath, vomiting then call 911 or go to the emergency department.

## 2022-03-15 NOTE — Progress Notes (Signed)
Subjective:     Patient ID: Hannah Best, female    DOB: [birth year], 42 y.o.   MRN: [id number]  Chief Complaint  Patient presents with   Blood In Stools    Noticed Sunday night and Monday morning. Has had blood in stool from hemorrhoids before but this was not that    HPI Patient is in today with c/o GI bleeding earlier this week. No rectal pain. States her stool looked like coffee grinds. Also has diarrhea. Epigastric pain with nausea. She has Pepcid but has not been taking it.   Hx of hemorrhoids. Hx of colonoscopy and polyp approximately 6 years ago.   Stopped breast feeding 5 days ago. She has a 52 1/44 year old.   Denies fever, chills, chest pain, palpitations, shortness of breath,  vomiting, urinary symptoms.    Health Maintenance Due  Topic Date Due   FOOT EXAM  Never done   OPHTHALMOLOGY EXAM  Never done   Diabetic kidney evaluation - Urine ACR  Never done   PAP SMEAR-Modifier  01/20/2017   HEMOGLOBIN A1C  09/14/2021    Past Medical History:  Diagnosis Date   Gestational diabetes    diet controlled   Herpes    Hyperlipidemia    Migraine    Thyroid disease     Past Surgical History:  Procedure Laterality Date   BREAST BIOPSY Bilateral    2 in L, 1 in R   CESAREAN SECTION N/A 08/09/2015   Procedure: CESAREAN SECTION;  Surgeon: Jerelyn Charles, MD;  Location: [facility];  Service: Obstetrics;  Laterality: N/A;   COLONOSCOPY     septorhinoplasty     thrombosed hemorrhoid     TONSILLECTOMY     TONSILLECTOMY AND ADENOIDECTOMY     TURBINATE REDUCTION     WISDOM TOOTH EXTRACTION      Family History  Problem Relation Age of Onset   Diabetes Father    Hypertension Father    Allergic rhinitis Father    Hyperlipidemia Father    Cancer Maternal Aunt        breast   Breast cancer Maternal Aunt    Diabetes Paternal Aunt    Diabetes Paternal Uncle    Cancer Maternal Grandmother        breast   Hypertension Maternal Grandmother    Heart  disease Maternal Grandmother    Stroke Maternal Grandmother    Hyperlipidemia Maternal Grandmother    Breast cancer Maternal Grandmother    Breast cancer Mother    Hyperlipidemia Mother    Hypertension Sister    Hyperlipidemia Sister    Other Sister        thyroid issue   Hyperlipidemia Maternal Grandfather    Diabetes Paternal Grandmother    Heart attack Paternal Grandmother    Breast cancer Paternal Grandmother    Allergic rhinitis Son     Social History   Socioeconomic History   Marital status: Married    Spouse name: Not on file   Number of children: 2   Years of education: 14   Highest education level: Some college, no degree  Occupational History   Not on file  Tobacco Use   Smoking status: Former    Packs/day: 0.15    Years: 1.00    Total pack years: 0.15    Types: Cigarettes   Smokeless tobacco: Never   Tobacco comments:    in highschool  Vaping Use   Vaping Use: Never used  Substance and Sexual  Activity   Alcohol use: Yes    Alcohol/week: 1.0 standard drink of alcohol    Types: 1 Standard drinks or equivalent per week    Comment: Social   Drug use: No   Sexual activity: Yes  Other Topics Concern   Not on file  Social History Narrative   Lives at home with husband & children   Right handed   Caffeine: 1 cup daily   Social Determinants of Health   Financial Resource Strain: Not on file  Food Insecurity: Not on file  Transportation Needs: Not on file  Physical Activity: Not on file  Stress: Not on file  Social Connections: Not on file  Intimate Partner Violence: Not on file    Outpatient Medications Prior to Visit  Medication Sig Dispense Refill   acyclovir (ZOVIRAX) 400 MG tablet Take 400 mg by mouth daily as needed (cold sore).   12   amoxicillin-clavulanate (AUGMENTIN) 875-125 MG tablet Take 1 tablet by mouth 2 (two) times daily for 10 days. 20 tablet 0   Cholecalciferol 25 MCG (1000 UT) tablet Take by mouth.     EPINEPHrine 0.3 mg/0.3 mL  IJ SOAJ injection Inject 0.3 mg into the muscle as needed for anaphylaxis. 1 each 0   famotidine (PEPCID) 20 MG tablet Take 1 tablet (20 mg total) by mouth 2 (two) times daily. 10 tablet 0   Fish Oil-Cholecalciferol (FISH OIL + D3) 1200-1000 MG-UNIT CAPS Take 1 capsule by mouth daily.      levothyroxine (SYNTHROID) 75 MCG tablet TAKE 1 TABLET BY MOUTH EVERY DAY BEFORE BREAKFAST 90 tablet 1   Prenatal Vit-Fe Fumarate-FA (MULTIVITAMIN-PRENATAL) 27-0.8 MG TABS tablet Take 1 tablet by mouth daily at 12 noon.     valACYclovir (VALTREX) 1000 MG tablet Take 2 tablets (2,000 mg total) by mouth 2 (two) times daily. Take for 1 day for outbreak of cold sores 12 tablet 5   No facility-administered medications prior to visit.    Allergies  Allergen Reactions   Latex Anaphylaxis   Shea Butter Anaphylaxis   Ciprofloxacin Other (See Comments)    Muscle spasms in legs, pt states she couldn't walk   Hydrocodone Nausea And Vomiting   Hydrocodone-Acetaminophen Nausea And Vomiting   Hydromorphone Nausea And Vomiting   Oxycodone Nausea And Vomiting    ROS     Objective:    Physical Exam Constitutional:      General: She is not in acute distress.    Appearance: She is not ill-appearing.  HENT:     Mouth/Throat:     Mouth: Mucous membranes are moist.  Eyes:     Extraocular Movements: Extraocular movements intact.     Conjunctiva/sclera: Conjunctivae normal.  Abdominal:     General: Bowel sounds are normal. There is no distension.     Palpations: Abdomen is soft.     Tenderness: There is abdominal tenderness in the epigastric area. There is no guarding or rebound. Negative signs include Murphy's sign and McBurney's sign.  Skin:    General: Skin is warm and dry.  Neurological:     General: No focal deficit present.     Mental Status: She is alert and oriented to person, place, and time.  Psychiatric:        Mood and Affect: Mood normal.        Behavior: Behavior normal.        Thought Content:  Thought content normal.     BP 106/80 (BP Location: Left Arm, Patient Position: Sitting,  Cuff Size: Large)   Pulse 67   Temp 97.6 F (36.4 C) (Temporal)   Ht '5\' 4"'$  (1.626 m)   Wt 141 lb (64 kg)   SpO2 98%   BMI 24.20 kg/m  Wt Readings from Last 3 Encounters:  03/15/22 141 lb (64 kg)  03/09/22 141 lb (64 kg)  07/21/21 142 lb 8 oz (64.6 kg)       Assessment & Plan:   Problem List Items Addressed This Visit       Endocrine   Hypothyroidism   Relevant Orders   TSH (Completed)   Other Visit Diagnoses     Gastrointestinal hemorrhage, unspecified gastrointestinal hemorrhage type    -  Primary   Relevant Medications   pantoprazole (PROTONIX) 40 MG tablet   Other Relevant Orders   CBC with Differential/Platelet (Completed)   Comprehensive metabolic panel (Completed)   Epigastric pain       Relevant Medications   pantoprazole (PROTONIX) 40 MG tablet   Other Relevant Orders   CBC with Differential/Platelet (Completed)   Comprehensive metabolic panel (Completed)   Lipase (Completed)   Gastroesophageal reflux disease, unspecified whether esophagitis present       Relevant Medications   pantoprazole (PROTONIX) 40 MG tablet      Symptoms concerning for upper GI bleed, ulcer, GERD.  Start pantoprazole. May take Pepcid in the evening.  Limit triggers for GERD.  Avoid alcohol and NSAIDs.  Check labs.  She will call her GI and schedule an urgent appt. She will let me know if she is unable to get an appt.   I am having Sherol T. Brining start on pantoprazole. I am also having her maintain her Fish Oil + D3, acyclovir, multivitamin-prenatal, Cholecalciferol, famotidine, EPINEPHrine, levothyroxine, amoxicillin-clavulanate, and valACYclovir.  Meds ordered this encounter  Medications   pantoprazole (PROTONIX) 40 MG tablet    Sig: Take 1 tablet (40 mg total) by mouth daily.    Dispense:  30 tablet    Refill:  1    Order Specific Question:   Supervising Provider    Answer:    Pricilla Holm A [id number]

## 2022-03-20 ENCOUNTER — Telehealth: Payer: Self-pay | Admitting: Emergency Medicine

## 2022-03-20 DIAGNOSIS — R1013 Epigastric pain: Secondary | ICD-10-CM

## 2022-03-20 DIAGNOSIS — K922 Gastrointestinal hemorrhage, unspecified: Secondary | ICD-10-CM

## 2022-03-20 NOTE — Telephone Encounter (Signed)
P:t states Hannah Best recommended she she a someone in gastroenterology and called to make an appoint ment at Dr. Jossie Ng office but was told she would need a referral. Called [place] to see if we could start the referral for her.   Dr. Carol Ada: [phone] Pt: [phone]

## 2022-03-20 NOTE — Addendum Note (Signed)
Addended by: Rossie Muskrat on: 03/20/2022 02:10 PM   Modules accepted: Orders

## 2022-03-20 NOTE — Telephone Encounter (Signed)
Ok to place GI referral?  Per your OV notes you told her to call and schedule with her GI and let us know if she has any issues getting an appt

## 2022-03-20 NOTE — Telephone Encounter (Signed)
Urgent referral placed

## 2022-04-08 ENCOUNTER — Other Ambulatory Visit: Payer: Self-pay | Admitting: Family Medicine

## 2022-04-08 DIAGNOSIS — R1013 Epigastric pain: Secondary | ICD-10-CM

## 2022-04-08 DIAGNOSIS — K922 Gastrointestinal hemorrhage, unspecified: Secondary | ICD-10-CM

## 2022-07-24 ENCOUNTER — Telehealth: Payer: Self-pay | Admitting: *Deleted

## 2022-07-24 NOTE — Telephone Encounter (Signed)
Attempted to reach the patient to schedule a new patient appt with Dr Alvester Morin on 7/29. LMOM for the patient to call the office to be scheduled for a new patient appt

## 2022-07-25 NOTE — Telephone Encounter (Signed)
Spoke with the patient regarding the referral to GYN oncology. Patient scheduled as new patient with Dr Alvester Morin on 08/06/2022. Patient given an arrival time of 10:45am.  Explained to the patient the the doctor will perform a pelvic exam at this visit. Patient given the policy that only one visitor allowed and that visitor must be over 16 yrs are allowed in the Cancer Center. Patient given the address/phone number for the clinic and that the center offers free valet service. Patient aware that masks are option.

## 2022-07-31 ENCOUNTER — Encounter: Payer: Self-pay | Admitting: Psychiatry

## 2022-08-06 ENCOUNTER — Telehealth: Payer: Self-pay

## 2022-08-06 ENCOUNTER — Inpatient Hospital Stay: Payer: Managed Care, Other (non HMO) | Admitting: Psychiatry

## 2022-08-06 DIAGNOSIS — Z1501 Genetic susceptibility to malignant neoplasm of breast: Secondary | ICD-10-CM

## 2022-08-06 NOTE — Progress Notes (Signed)
GYNECOLOGIC ONCOLOGY NEW PATIENT CONSULTATION  Date of Service: 08/06/2022 Referring Provider: Marlow Baars, MD   ASSESSMENT AND PLAN: Hannah Best is a 42 y.o. woman with ***.  ***    A copy of this note was sent to the patient's referring provider.  Clide Cliff, MD Gynecologic Oncology   Medical Decision Making I personally spent  TOTAL *** minutes face-to-face and non-face-to-face in the care of this patient, which includes all pre, intra, and post visit time on the date of service.  *** minutes spent reviewing records prior to the visit *** Minutes in patient contact      *** minutes in other billable services *** minutes charting , conferring with consultants etc.   ------------  CC: {GynOnc Types:[id number]}  HISTORY OF PRESENT ILLNESS:  Hannah Best is a 42 y.o. woman who is seen in consultation at the request of Marlow Baars, MD for evaluation of ***.  Patient presented to her OB/GYN on 07/28/2022 for an annual exam.  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    PAST MEDICAL HISTORY: Past Medical History:  Diagnosis Date   Gestational diabetes    diet controlled   Herpes    Hyperlipidemia    Migraine    Thyroid disease     PAST SURGICAL HISTORY: Past Surgical History:  Procedure Laterality Date   BREAST BIOPSY Bilateral    2 in L, 1 in R   CESAREAN SECTION N/A 08/09/2015   Procedure: CESAREAN SECTION;  Surgeon: Marlow Baars, MD;  Location: [facility];  Service: Obstetrics;  Laterality: N/A;   COLONOSCOPY     Gum Graft     01/2022   septorhinoplasty     thrombosed hemorrhoid      TONSILLECTOMY     TONSILLECTOMY AND ADENOIDECTOMY     TURBINATE REDUCTION     WISDOM TOOTH EXTRACTION      OB/GYN HISTORY: OB History  Gravida Para Term Preterm AB Living  4 3 3   1 3   SAB IAB Ectopic Multiple Live Births  1     0 3    # Outcome Date GA Lbr Len/2nd Weight Sex Type Anes PTL Lv  4 Term 10/20/19 [redacted]w[redacted]d / 00:17 6 lb 4 oz (2.835 kg) F VBAC EPI  LIV  3 Term 08/09/15 [redacted]w[redacted]d  6 lb 11.2 oz (3.04 kg) F CS-LTranv Spinal  LIV  2 Term 2003   6 lb 14 oz (3.118 kg) M Vag-Spont EPI  LIV  1 SAB               Age at menarche: *** Age at menopause: *** Hx of HRT: *** Hx of STI: *** Last pap: 03/06/17 neg, HPV neg*** History of abnormal pap smears: 2010 colpo, normal paps after  SCREENING STUDIES:  Last mammogram: *** Last colonoscopy: ***  MEDICATIONS:  Current Outpatient Medications:    Cholecalciferol 25 MCG (1000 UT) tablet, Take by mouth., Disp: , Rfl:    EPINEPHrine 0.3 mg/0.3 mL IJ SOAJ injection, Inject 0.3 mg into the muscle as needed for anaphylaxis., Disp: 1 each, Rfl: 0   famotidine (PEPCID) 20 MG tablet, Take 1  tablet (20 mg total) by mouth 2 (two) times daily., Disp: 10 tablet, Rfl: 0   levothyroxine (SYNTHROID) 75 MCG tablet, TAKE 1 TABLET BY MOUTH EVERY DAY BEFORE BREAKFAST, Disp: 90 tablet, Rfl: 1   Prenatal Vit-Fe Fumarate-FA (MULTIVITAMIN-PRENATAL) 27-0.8 MG TABS tablet, Take 1 tablet by mouth daily at 12 noon., Disp: , Rfl:    valACYclovir (VALTREX) 1000 MG tablet, Take 2 tablets (2,000 mg total) by mouth 2 (two) times daily. Take for 1 day for outbreak of cold sores, Disp: 12 tablet, Rfl: 5  ALLERGIES: Allergies  Allergen Reactions   Latex Anaphylaxis   Shea Butter Anaphylaxis   Ciprofloxacin Other (See Comments)    Muscle spasms in legs, pt states she couldn't walk   Hydrocodone Nausea And Vomiting   Hydrocodone-Acetaminophen Nausea And Vomiting   Hydromorphone Nausea And Vomiting   Oxycodone Nausea And Vomiting    FAMILY HISTORY: Family History   Problem Relation Age of Onset   Breast cancer Mother    Hyperlipidemia Mother    Diabetes Father    Hypertension Father    Allergic rhinitis Father    Hyperlipidemia Father    Hypertension Sister    Hyperlipidemia Sister    Other Sister        thyroid issue   Cancer Maternal Aunt        breast   Breast cancer Maternal Aunt    Diabetes Paternal Aunt    Diabetes Paternal Uncle    Cancer Maternal Grandmother        breast   Hypertension Maternal Grandmother    Heart disease Maternal Grandmother    Stroke Maternal Grandmother    Hyperlipidemia Maternal Grandmother    Breast cancer Maternal Grandmother    Hyperlipidemia Maternal Grandfather    Diabetes Paternal Grandmother    Heart attack Paternal Grandmother    Breast cancer Paternal Grandmother    Allergic rhinitis Son    Colon cancer Neg Hx    Ovarian cancer Neg Hx    Pancreatic cancer Neg Hx    Prostate cancer Neg Hx     SOCIAL HISTORY: Social History   Socioeconomic History   Marital status: Married    Spouse name: Not on file   Number of children: 2   Years of education: 14   Highest education level: Some college, no degree  Occupational History   Not on file  Tobacco Use   Smoking status: Former    Current packs/day: 0.15    Average packs/day: 0.2 packs/day for 1 year (0.2 ttl pk-yrs)    Types: Cigarettes   Smokeless tobacco: Never   Tobacco comments:    in highschool  Vaping Use   Vaping status: Never Used  Substance and Sexual Activity   Alcohol use: Yes    Alcohol/week: 1.0 standard drink of alcohol    Types: 1 Standard drinks or equivalent per week    Comment: Social   Drug use: No   Sexual activity: Yes  Other Topics Concern   Not on file  Social History Narrative   Lives at home with husband & children   Right handed   Caffeine: 1 cup daily   Social Determinants of Health   Financial Resource Strain: Not on file  Food Insecurity: Not on file  Transportation Needs: Not on file   Physical Activity: Not on file  Stress: Not on file  Social Connections: Unknown (05/09/2022)   Received from [facility]   Social Network    Social Network: Not on  file  Intimate Partner Violence: Unknown (05/09/2022)   Received from [facility]   HITS    Physically Hurt: Not on file    Insult or Talk Down To: Not on file    Threaten Physical Harm: Not on file    Scream or Curse: Not on file    REVIEW OF SYSTEMS: New patient intake form was reviewed.  Complete 10-system review is negative except for the following: ***  PHYSICAL EXAM: There were no vitals taken for this visit. Constitutional: No acute distress. Neuro/Psych: ***Alert, oriented.  Head and Neck: ***Normocephalic, atraumatic. Neck symmetric without masses. Sclera anicteric.  Respiratory: ***Normal work of breathing. ***Clear to auscultation bilaterally. Cardiovascular: ***Regular rate and rhythm, no murmurs, rubs, or gallops. Abdomen: ***Normoactive bowel sounds. ***Soft, non-distended, non-tender to palpation. ***No masses or hepatosplenomegaly appreciated. ***No evidence of hernia. ***No palpable fluid wave. ***incisions. Extremities: ***Grossly normal range of motion. Warm, well perfused. No edema bilaterally. Skin: ***No rashes or lesions. Lymphatic: ***No cervical, supraclavicular, or inguinal adenopathy. Genitourinary: ***External genitalia without lesions. Urethral meatus ***without lesions or prolapse. On speculum exam, ***vagina and cervix without lesions. Bimanual exam reveals ***. ***Rectovaginal exam confirms the above findings and reveals normal sphincter tone and ***no masses or nodularity. Exam chaperoned by ***  LABORATORY AND RADIOLOGIC DATA: ***Outside medical records were reviewed to synthesize the above history, along with the history and physical obtained during the visit.  Outside ***laboratory, pathology, and imaging reports were reviewed, with pertinent results below.  ***I personally reviewed  the outside images.  WBC  Date Value Ref Range Status  03/15/2022 5.6 4.0 - 10.5 K/uL Final   Hemoglobin  Date Value Ref Range Status  03/15/2022 13.6 12.0 - 15.0 g/dL Final  16/10/9602 54.0 11.1 - 15.9 g/dL Final   HCT  Date Value Ref Range Status  03/15/2022 40.0 36.0 - 46.0 % Final   Hematocrit  Date Value Ref Range Status  07/21/2021 41.4 34.0 - 46.6 % Final   Platelets  Date Value Ref Range Status  03/15/2022 237.0 150.0 - 400.0 K/uL Final   Creatinine, Ser  Date Value Ref Range Status  03/15/2022 0.76 0.40 - 1.20 mg/dL Final   AST  Date Value Ref Range Status  03/15/2022 18 0 - 37 U/L Final   ALT  Date Value Ref Range Status  03/15/2022 11 0 - 35 U/L Final    ***

## 2022-08-06 NOTE — Telephone Encounter (Signed)
Patient called and left message stating that Dr Alvester Morin was not in network.  I called the patient and explained to her that she would need to contact her insurance company and find a provided in network.  Also advised her to contact the referring provider and let them know.  Patient confirmed and understood.

## 2022-08-06 NOTE — Telephone Encounter (Signed)
Patient called in to state that we are not in network with her insurance.  Cancelled patients new patient appointment.  Advised patient to contact insurance office and referring MD.  Patient confirmed and understood.

## 2022-08-12 ENCOUNTER — Other Ambulatory Visit: Payer: Self-pay | Admitting: Emergency Medicine

## 2022-10-10 ENCOUNTER — Encounter: Payer: Self-pay | Admitting: Emergency Medicine

## 2022-10-10 ENCOUNTER — Ambulatory Visit: Payer: Managed Care, Other (non HMO) | Admitting: Emergency Medicine

## 2022-10-10 VITALS — BP 110/78 | HR 89 | Temp 98.3°F | Ht 64.0 in | Wt 142.0 lb

## 2022-10-10 DIAGNOSIS — Z1329 Encounter for screening for other suspected endocrine disorder: Secondary | ICD-10-CM | POA: Diagnosis not present

## 2022-10-10 DIAGNOSIS — Z13228 Encounter for screening for other metabolic disorders: Secondary | ICD-10-CM | POA: Diagnosis not present

## 2022-10-10 DIAGNOSIS — Z130 Encounter for screening for diseases of the blood and blood-forming organs and certain disorders involving the immune mechanism: Secondary | ICD-10-CM

## 2022-10-10 DIAGNOSIS — Z13220 Encounter for screening for lipoid disorders: Secondary | ICD-10-CM

## 2022-10-10 DIAGNOSIS — Z0000 Encounter for general adult medical examination without abnormal findings: Secondary | ICD-10-CM | POA: Diagnosis not present

## 2022-10-10 DIAGNOSIS — E039 Hypothyroidism, unspecified: Secondary | ICD-10-CM | POA: Diagnosis not present

## 2022-10-10 LAB — COMPREHENSIVE METABOLIC PANEL
ALT: 11 U/L (ref 0–35)
AST: 21 U/L (ref 0–37)
Albumin: 4.5 g/dL (ref 3.5–5.2)
Alkaline Phosphatase: 56 U/L (ref 39–117)
BUN: 12 mg/dL (ref 6–23)
CO2: 26 meq/L (ref 19–32)
Calcium: 9.6 mg/dL (ref 8.4–10.5)
Chloride: 103 meq/L (ref 96–112)
Creatinine, Ser: 0.88 mg/dL (ref 0.40–1.20)
GFR: 80.27 mL/min (ref >60.00)
Glucose, Bld: 105 mg/dL — ABNORMAL HIGH (ref 70–99)
Potassium: 3.9 meq/L (ref 3.5–5.1)
Sodium: 139 meq/L (ref 135–145)
Total Bilirubin: 0.5 mg/dL (ref 0.2–1.2)
Total Protein: 7.6 g/dL (ref 6.0–8.3)

## 2022-10-10 LAB — CBC WITH DIFFERENTIAL/PLATELET
Basophils Absolute: 0.1 10*3/uL (ref 0.0–0.1)
Basophils Relative: 1 % (ref 0.0–3.0)
Eosinophils Absolute: 0.2 10*3/uL (ref 0.0–0.7)
Eosinophils Relative: 4.3 % (ref 0.0–5.0)
HCT: 40.5 % (ref 36.0–46.0)
Hemoglobin: 13.6 g/dL (ref 12.0–15.0)
Lymphocytes Relative: 23.8 % (ref 12.0–46.0)
Lymphs Abs: 1.3 10*3/uL (ref 0.7–4.0)
MCHC: 33.6 g/dL (ref 30.0–36.0)
MCV: 89.4 fL (ref 78.0–100.0)
Monocytes Absolute: 0.3 10*3/uL (ref 0.1–1.0)
Monocytes Relative: 6.2 % (ref 3.0–12.0)
Neutro Abs: 3.5 10*3/uL (ref 1.4–7.7)
Neutrophils Relative %: 64.7 % (ref 43.0–77.0)
Platelets: 228 10*3/uL (ref 150.0–400.0)
RBC: 4.53 Mil/uL (ref 3.87–5.11)
RDW: 12.1 % (ref 11.5–15.5)
WBC: 5.5 10*3/uL (ref 4.0–10.5)

## 2022-10-10 LAB — LIPID PANEL
Cholesterol: 229 mg/dL — ABNORMAL HIGH (ref 0–200)
HDL: 69 mg/dL (ref >39.00)
LDL Cholesterol: 142 mg/dL — ABNORMAL HIGH (ref 0–99)
NonHDL: 159.63
Total CHOL/HDL Ratio: 3
Triglycerides: 88 mg/dL (ref 0.0–149.0)
VLDL: 17.6 mg/dL (ref 0.0–40.0)

## 2022-10-10 LAB — HEMOGLOBIN A1C: Hgb A1c MFr Bld: 5.4 % (ref 4.6–6.5)

## 2022-10-10 LAB — TSH: TSH: 2.27 u[IU]/mL (ref 0.35–5.50)

## 2022-10-10 NOTE — Patient Instructions (Signed)

## 2022-10-10 NOTE — Progress Notes (Signed)
Hannah Best 42 y.o.   Chief Complaint  Patient presents with   Annual Exam    Patient states she wants to be tested for strep, not feeling well. Sore throat     HISTORY OF PRESENT ILLNESS: This is a 42 y.o. female here for annual exam. Complaining of sore throat and not feeling very well. Overall doing well. 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 Seeing gynecologist from [facility] May have small umbilical hernia. No other complaints or medical concerns today.  HPI   Prior to Admission medications   Medication Sig Start Date End Date Taking? Authorizing Provider  Cholecalciferol 25 MCG (1000 UT) tablet Take by mouth.    [provider]  EPINEPHrine 0.3 mg/0.3 mL IJ SOAJ injection Inject 0.3 mg into the muscle as needed for anaphylaxis. 06/02/21   Zenia Resides, MD  levothyroxine (SYNTHROID) 75 MCG tablet TAKE 1 TABLET BY MOUTH EVERY DAY BEFORE BREAKFAST 08/13/22   Georgina Quint, MD  Prenatal Vit-Fe Fumarate-FA (MULTIVITAMIN-PRENATAL) 27-0.8 MG TABS tablet Take 1 tablet by mouth daily at 12 noon.    [provider]  valACYclovir (VALTREX) 1000 MG tablet Take 2 tablets (2,000 mg total) by mouth 2 (two) times daily. Take for 1 day for outbreak of cold sores 03/09/22   Pincus Sanes, MD    Allergies  Allergen Reactions   Latex Anaphylaxis   [name] Anaphylaxis   Ciprofloxacin Other (See Comments)    Muscle spasms in legs, pt states she couldn't walk   Hydrocodone Nausea And Vomiting   Hydrocodone-Acetaminophen Nausea And Vomiting   Hydromorphone Nausea And Vomiting   Oxycodone Nausea And Vomiting    Patient Active Problem List   Diagnosis Date Noted   Maternal age 65+, multigravida, antepartum 07/21/2021   History of cesarean section 07/21/2021   History of hypertension 07/21/2021   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   Vitiligo 10/06/2020   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   History of gestational diabetes 10/06/2020    First degree hemorrhoids 03/15/2020   Hypothyroidism 10/19/2019   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   Family history of BRCA gene mutation 07/23/2018   Hyperlipidemia 07/28/2015   IBS (irritable bowel syndrome) 07/28/2015    Past Medical History:  Diagnosis Date   Gestational diabetes    diet controlled   Herpes    Hyperlipidemia    Migraine    Thyroid disease     Past Surgical History:  Procedure Laterality Date   BREAST BIOPSY Bilateral    2 in L, 1 in R   CESAREAN SECTION N/A 08/09/2015   Procedure: CESAREAN SECTION;  Surgeon: Marlow Baars, MD;  Location: [facility];  Service: Obstetrics;  Laterality: N/A;   COLONOSCOPY     Gum Graft     01/2022   septorhinoplasty     thrombosed hemorrhoid     TONSILLECTOMY     TONSILLECTOMY AND ADENOIDECTOMY     TURBINATE REDUCTION     WISDOM TOOTH EXTRACTION      Social History   Socioeconomic History   Marital status: Married    Spouse name: Not on file   Number of children: 2   Years of education: 14   Highest education level: Associate degree: occupational, Scientist, product/process development, or vocational program  Occupational History   Not on file  Tobacco Use   Smoking status: Former    Current packs/day: 0.15    Average packs/day: 0.2 packs/day for 1 year (  0.2 ttl pk-yrs)    Types: Cigarettes   Smokeless tobacco: Never   Tobacco comments:    in highschool  Vaping Use   Vaping status: Never Used  Substance and Sexual Activity   Alcohol use: Yes    Alcohol/week: 1.0 standard drink of alcohol    Types: 1 Standard drinks or equivalent per week    Comment: Social   Drug use: No   Sexual activity: Yes  Other Topics Concern   Not on file  Social History Narrative   Lives at home with husband & children   Right handed   Caffeine: 1 cup daily   Social Determinants of Health   Financial Resource Strain: Low Risk  (10/09/2022)   Overall Financial Resource Strain (CARDIA)    Difficulty of Paying Living  Expenses: Not very hard  Recent Concern: Financial Resource Strain - Medium Risk (08/20/2022)   Received from [facility]   Overall Financial Resource Strain (CARDIA)    Difficulty of Paying Living Expenses: Somewhat hard  Food Insecurity: Patient Declined (10/09/2022)   Hunger Vital Sign    Worried About Running Out of Food in the Last Year: Patient declined    Ran Out of Food in the Last Year: Patient declined  Recent Concern: Food Insecurity - Food Insecurity Present (08/20/2022)   Received from [facility]   Hunger Vital Sign    Worried About Running Out of Food in the Last Year: Sometimes true    Ran Out of Food in the Last Year: Sometimes true  Transportation Needs: No Transportation Needs (10/09/2022)   PRAPARE - Administrator, Civil Service (Medical): No    Lack of Transportation (Non-Medical): No  Physical Activity: Insufficiently Active (10/09/2022)   Exercise Vital Sign    Days of Exercise per Week: 5 days    Minutes of Exercise per Session: 20 min  Stress: Stress Concern Present (10/09/2022)   [employer] of Occupational Health - Occupational Stress Questionnaire    Feeling of Stress : To some extent  Social Connections: Socially Integrated (10/09/2022)   Social Connection and Isolation Panel [NHANES]    Frequency of Communication with Friends and Family: Twice a week    Frequency of Social Gatherings with Friends and Family: Once a week    Attends Religious Services: More than 4 times per year    Active Member of [employer] or Organizations: Yes    Attends Engineer, structural: More than 4 times per year    Marital Status: Married  Catering manager Violence: Not At Risk (08/20/2022)   Received from [facility]   HITS    Over the last 12 months how often did your partner physically hurt you?: 1    Over the last 12 months how often did your partner insult you or talk down to you?: 1    Over the last 12 months how often did your partner threaten you  with physical harm?: 1    Over the last 12 months how often did your partner scream or curse at you?: 1    Family History  Problem Relation Age of Onset   Breast cancer Mother    Hyperlipidemia Mother    Diabetes Father    Hypertension Father    Allergic rhinitis Father    Hyperlipidemia Father    Hypertension Sister    Hyperlipidemia Sister    Other Sister        thyroid issue   Cancer Maternal Aunt  breast   Breast cancer Maternal Aunt    Diabetes Paternal Aunt    Diabetes Paternal Uncle    Cancer Maternal Grandmother        breast   Hypertension Maternal Grandmother    Heart disease Maternal Grandmother    Stroke Maternal Grandmother    Hyperlipidemia Maternal Grandmother    Breast cancer Maternal Grandmother    Hyperlipidemia Maternal Grandfather    Diabetes Paternal Grandmother    Heart attack Paternal Grandmother    Breast cancer Paternal Grandmother    Allergic rhinitis Son    Colon cancer Neg Hx    Ovarian cancer Neg Hx    Pancreatic cancer Neg Hx    Prostate cancer Neg Hx      Review of Systems  Constitutional: Negative.  Negative for chills and fever.  HENT:  Positive for sore throat.   Respiratory: Negative.  Negative for cough and shortness of breath.   Cardiovascular: Negative.  Negative for chest pain and palpitations.  Gastrointestinal:  Negative for abdominal pain, diarrhea, nausea and vomiting.  Genitourinary: Negative.  Negative for dysuria and hematuria.  Skin: Negative.  Negative for rash.  Neurological: Negative.  Negative for dizziness and headaches.  All other systems reviewed and are negative.   Today's Vitals   10/10/22 1306  BP: 110/78  Pulse: 89  Temp: 98.3 F (36.8 C)  TempSrc: Oral  SpO2: 98%  Weight: 142 lb (64.4 kg)  Height: 5\' 4"  (1.626 m)   Body mass index is 24.37 kg/m.   Physical Exam Vitals reviewed.  Constitutional:      Appearance: Normal appearance.  HENT:     Head: Normocephalic.     Right Ear:  Tympanic membrane, ear canal and external ear normal.     Left Ear: Tympanic membrane, ear canal and external ear normal.     Mouth/Throat:     Mouth: Mucous membranes are moist.     Pharynx: Oropharynx is clear. No oropharyngeal exudate or posterior oropharyngeal erythema.  Eyes:     Extraocular Movements: Extraocular movements intact.     Conjunctiva/sclera: Conjunctivae normal.     Pupils: Pupils are equal, round, and reactive to light.  Cardiovascular:     Rate and Rhythm: Normal rate and regular rhythm.     Pulses: Normal pulses.     Heart sounds: Normal heart sounds.  Pulmonary:     Effort: Pulmonary effort is normal.     Breath sounds: Normal breath sounds.  Abdominal:     Palpations: Abdomen is soft.     Tenderness: There is no abdominal tenderness.     Comments: Very small umbilical hernia  Musculoskeletal:     Cervical back: No tenderness.     Right lower leg: No edema.     Left lower leg: No edema.  Lymphadenopathy:     Cervical: No cervical adenopathy.  Skin:    General: Skin is warm and dry.  Neurological:     General: No focal deficit present.     Mental Status: She is alert and oriented to person, place, and time.  Psychiatric:        Mood and Affect: Mood normal.        Behavior: Behavior normal.      ASSESSMENT & PLAN: Problem List Items Addressed This Visit       Endocrine   Hypothyroidism   Relevant Orders   TSH   Other Visit Diagnoses     Routine general medical examination at a health  care facility    -  Primary   Relevant Orders   CBC with Differential   Comprehensive metabolic panel   Hemoglobin A1c   Lipid panel   Screening for deficiency anemia       Relevant Orders   CBC with Differential   Screening for lipoid disorders       Relevant Orders   Lipid panel   Screening for endocrine, metabolic and immunity disorder       Relevant Orders   Comprehensive metabolic panel   Hemoglobin A1c      Modifiable risk factors discussed  with patient. Anticipatory guidance according to age provided. The following topics were also discussed: Social Determinants of Health Smoking.  Non-smoker Diet and nutrition Benefits of exercise Cancer family history review Vaccinations review and recommendations Cardiovascular risk assessment and need for blood work Mental health including depression and anxiety Fall and accident prevention  Patient Instructions  Health Maintenance, Female Adopting a healthy lifestyle and getting preventive care are important in promoting health and wellness. Ask your health care provider about: The right schedule for you to have regular tests and exams. Things you can do on your own to prevent diseases and keep yourself healthy. What should I know about diet, weight, and exercise? Eat a healthy diet  Eat a diet that includes plenty of vegetables, fruits, low-fat dairy products, and lean protein. Do not eat a lot of foods that are high in solid fats, added sugars, or sodium. Maintain a healthy weight Body mass index (BMI) is used to identify weight problems. It estimates body fat based on height and weight. Your health care provider can help determine your BMI and help you achieve or maintain a healthy weight. Get regular exercise Get regular exercise. This is one of the most important things you can do for your health. Most adults should: Exercise for at least 150 minutes each week. The exercise should increase your heart rate and make you sweat (moderate-intensity exercise). Do strengthening exercises at least twice a week. This is in addition to the moderate-intensity exercise. Spend less time sitting. Even light physical activity can be beneficial. Watch cholesterol and blood lipids Have your blood tested for lipids and cholesterol at 44 years of age, then have this test every 5 years. Have your cholesterol levels checked more often if: Your lipid or cholesterol levels are high. You are older  than 44 years of age. You are at high risk for heart disease. What should I know about cancer screening? Depending on your health history and family history, you may need to have cancer screening at various ages. This may include screening for: Breast cancer. Cervical cancer. Colorectal cancer. Skin cancer. Lung cancer. What should I know about heart disease, diabetes, and high blood pressure? Blood pressure and heart disease High blood pressure causes heart disease and increases the risk of stroke. This is more likely to develop in people who have high blood pressure readings or are overweight. Have your blood pressure checked: Every 3-5 years if you are 6-32 years of age. Every year if you are 52 years old or older. Diabetes Have regular diabetes screenings. This checks your fasting blood sugar level. Have the screening done: Once every three years after age 29 if you are at a normal weight and have a low risk for diabetes. More often and at a younger age if you are overweight or have a high risk for diabetes. What should I know about preventing infection? Hepatitis B If  you have a higher risk for hepatitis B, you should be screened for this virus. Talk with your health care provider to find out if you are at risk for hepatitis B infection. Hepatitis C Testing is recommended for: Everyone born from [birth year] through 1965. Anyone with known risk factors for hepatitis C. Sexually transmitted infections (STIs) Get screened for STIs, including gonorrhea and chlamydia, if: You are sexually active and are younger than 44 years of age. You are older than 44 years of age and your health care provider tells you that you are at risk for this type of infection. Your sexual activity has changed since you were last screened, and you are at increased risk for chlamydia or gonorrhea. Ask your health care provider if you are at risk. Ask your health care provider about whether you are at high risk for  HIV. Your health care provider may recommend a prescription medicine to help prevent HIV infection. If you choose to take medicine to prevent HIV, you should first get tested for HIV. You should then be tested every 3 months for as long as you are taking the medicine. Pregnancy If you are about to stop having your period (premenopausal) and you may become pregnant, seek counseling before you get pregnant. Take 400 to 800 micrograms (mcg) of folic acid every day if you become pregnant. Ask for birth control (contraception) if you want to prevent pregnancy. Osteoporosis and menopause Osteoporosis is a disease in which the bones lose minerals and strength with aging. This can result in bone fractures. If you are 5 years old or older, or if you are at risk for osteoporosis and fractures, ask your health care provider if you should: Be screened for bone loss. Take a calcium or vitamin D supplement to lower your risk of fractures. Be given hormone replacement therapy (HRT) to treat symptoms of menopause. Follow these instructions at home: Alcohol use Do not drink alcohol if: Your health care provider tells you not to drink. You are pregnant, may be pregnant, or are planning to become pregnant. If you drink alcohol: Limit how much you have to: 0-1 drink a day. Know how much alcohol is in your drink. In the [place] one drink equals one 12 oz bottle of beer (355 mL), one 5 oz glass of wine (148 mL), or one 1 oz glass of hard liquor (44 mL). Lifestyle Do not use any products that contain nicotine or tobacco. These products include cigarettes, chewing tobacco, and vaping devices, such as e-cigarettes. If you need help quitting, ask your health care provider. Do not use street drugs. Do not share needles. Ask your health care provider for help if you need support or information about quitting drugs. General instructions Schedule regular health, dental, and eye exams. Stay current with your  vaccines. Tell your health care provider if: You often feel depressed. You have ever been abused or do not feel safe at home. Summary Adopting a healthy lifestyle and getting preventive care are important in promoting health and wellness. Follow your health care provider's instructions about healthy diet, exercising, and getting tested or screened for diseases. Follow your health care provider's instructions on monitoring your cholesterol and blood pressure. This information is not intended to replace advice given to you by your health care provider. Make sure you discuss any questions you have with your health care provider. Document Revised: 05/16/2020 Document Reviewed: 05/16/2020 Elsevier Patient Education  2024 Elsevier Inc.       Edwina Barth, MD  [facility]

## 2022-10-16 ENCOUNTER — Ambulatory Visit: Payer: Managed Care, Other (non HMO) | Admitting: Emergency Medicine

## 2022-10-17 ENCOUNTER — Ambulatory Visit: Payer: Managed Care, Other (non HMO) | Admitting: Emergency Medicine

## 2022-11-06 ENCOUNTER — Ambulatory Visit (INDEPENDENT_AMBULATORY_CARE_PROVIDER_SITE_OTHER): Payer: Managed Care, Other (non HMO) | Admitting: Internal Medicine

## 2022-11-06 ENCOUNTER — Encounter: Payer: Self-pay | Admitting: Internal Medicine

## 2022-11-06 ENCOUNTER — Ambulatory Visit: Payer: Managed Care, Other (non HMO) | Admitting: Emergency Medicine

## 2022-11-06 VITALS — BP 122/76 | HR 84 | Temp 98.6°F | Ht 64.0 in | Wt 142.0 lb

## 2022-11-06 DIAGNOSIS — Z8632 Personal history of gestational diabetes: Secondary | ICD-10-CM

## 2022-11-06 DIAGNOSIS — R058 Other specified cough: Secondary | ICD-10-CM | POA: Diagnosis not present

## 2022-11-06 DIAGNOSIS — Z8679 Personal history of other diseases of the circulatory system: Secondary | ICD-10-CM

## 2022-11-06 MED ORDER — PROMETHAZINE-DM 6.25-15 MG/5ML PO SYRP: 5.0000 mL | ORAL_SOLUTION | Freq: Four times a day (QID) | ORAL | 0 refills | Status: AC | PRN

## 2022-11-06 MED ORDER — PROMETHAZINE-CODEINE 6.25-10 MG/5ML PO SYRP: 5.0000 mL | ORAL_SOLUTION | ORAL | 0 refills | Status: AC | PRN

## 2022-11-06 MED ORDER — AZITHROMYCIN 250 MG PO TABS: ORAL_TABLET | ORAL | 1 refills | Status: AC

## 2022-11-06 NOTE — Progress Notes (Signed)
Patient ID: Hannah Best, female   DOB: [birth year], 42 y.o.   MRN: [id number]        Chief Complaint: follow up prod cough, htn,hyperglycemia       HPI:  Hannah Best is a 42 y.o. female Here with acute onset mild to mod 2-3 days ST, HA, general weakness and malaise, with prod cough greenish sputum, but Pt denies chest pain, increased sob or doe, wheezing, orthopnea, PND, increased LE swelling, palpitations, dizziness or syncope.   Pt denies polydipsia, polyuria, or new focal neuro s/s.    Pt denies recent wt loss, night sweats, loss of appetite, or other constitutional symptoms.  Daughter with recent walking pna tx with zpack       Wt Readings from Last 3 Encounters:  11/06/22 142 lb (64.4 kg)  10/10/22 142 lb (64.4 kg)  03/15/22 141 lb (64 kg)   BP Readings from Last 3 Encounters:  11/06/22 122/76  10/10/22 110/78  03/15/22 106/80         Past Medical History:  Diagnosis Date   Gestational diabetes    diet controlled   Herpes    Hyperlipidemia    Migraine    Thyroid disease    Past Surgical History:  Procedure Laterality Date   BREAST BIOPSY Bilateral    2 in L, 1 in R   CESAREAN SECTION N/A 08/09/2015   Procedure: CESAREAN SECTION;  Surgeon: Marlow Baars, MD;  Location: [facility];  Service: Obstetrics;  Laterality: N/A;   COLONOSCOPY     Gum Graft     01/2022   septorhinoplasty     thrombosed hemorrhoid     TONSILLECTOMY     TONSILLECTOMY AND ADENOIDECTOMY     TURBINATE REDUCTION     WISDOM TOOTH EXTRACTION      reports that she has quit smoking. Her smoking use included cigarettes. She has a 0.2 pack-year smoking history. She has never used smokeless tobacco. She reports current alcohol use of about 1.0 standard drink of alcohol per week. She reports that she does not use drugs. family history includes Allergic rhinitis in her father and son; Breast cancer in her maternal aunt, maternal grandmother, mother, and paternal grandmother;  Cancer in her maternal aunt and maternal grandmother; Diabetes in her father, paternal aunt, paternal grandmother, and paternal uncle; Heart attack in her paternal grandmother; Heart disease in her maternal grandmother; Hyperlipidemia in her father, maternal grandfather, maternal grandmother, mother, and sister; Hypertension in her father, maternal grandmother, and sister; Other in her sister; Stroke in her maternal grandmother. Allergies  Allergen Reactions   Latex Anaphylaxis   Shea Butter Anaphylaxis   Ciprofloxacin Other (See Comments)    Muscle spasms in legs, pt states she couldn't walk   Hydrocodone Nausea And Vomiting   Hydrocodone-Acetaminophen Nausea And Vomiting   Hydromorphone Nausea And Vomiting   Oxycodone Nausea And Vomiting   Current Outpatient Medications on File Prior to Visit  Medication Sig Dispense Refill   Cholecalciferol 25 MCG (1000 UT) tablet Take by mouth.     EPINEPHrine 0.3 mg/0.3 mL IJ SOAJ injection Inject 0.3 mg into the muscle as needed for anaphylaxis. 1 each 0   levothyroxine (SYNTHROID) 75 MCG tablet TAKE 1 TABLET BY MOUTH EVERY DAY BEFORE BREAKFAST 90 tablet 1   Prenatal Vit-Fe Fumarate-FA (MULTIVITAMIN-PRENATAL) 27-0.8 MG TABS tablet Take 1 tablet by mouth daily at 12 noon.     valACYclovir (VALTREX) 1000 MG tablet Take 2 tablets (2,000 mg total) by mouth  2 (two) times daily. Take for 1 day for outbreak of cold sores 12 tablet 5   No current facility-administered medications on file prior to visit.        ROS:  All others reviewed and negative.  Objective        PE:  BP 122/76 (BP Location: Right Arm, Patient Position: Sitting, Cuff Size: Normal)   Pulse 84   Temp 98.6 F (37 C) (Oral)   Ht 5\' 4"  (1.626 m)   Wt 142 lb (64.4 kg)   SpO2 98%   BMI 24.37 kg/m                 Constitutional: Pt appears mild ill               HENT: Head: NCAT.                Right Ear: External ear normal.                 Left Ear: External ear normal. Bilat tm's  with mild erythema.  Max sinus areas non tender.  Pharynx with mild erythema, no exudate               Eyes: . Pupils are equal, round, and reactive to light. Conjunctivae and EOM are normal               Nose: without d/c or deformity               Neck: Neck supple. Gross normal ROM               Cardiovascular: Normal rate and regular rhythm.                 Pulmonary/Chest: Effort normal and breath sounds without rales or wheezing.                Abd:  Soft, NT, ND, + BS, no organomegaly               Neurological: Pt is alert. At baseline orientation, motor grossly intact               Skin: Skin is warm. No rashes, no other new lesions, LE edema - none               Psychiatric: Pt behavior is normal without agitation   Micro: none  Cardiac tracings I have personally interpreted today:  none  Pertinent Radiological findings (summarize): none   Lab Results  Component Value Date   WBC 5.5 10/10/2022   HGB 13.6 10/10/2022   HCT 40.5 10/10/2022   PLT 228.0 10/10/2022   GLUCOSE 105 (H) 10/10/2022   CHOL 229 (H) 10/10/2022   TRIG 88.0 10/10/2022   HDL 69.00 10/10/2022   LDLCALC 142 (H) 10/10/2022   ALT 11 10/10/2022   AST 21 10/10/2022   NA 139 10/10/2022   K 3.9 10/10/2022   CL 103 10/10/2022   CREATININE 0.88 10/10/2022   BUN 12 10/10/2022   CO2 26 10/10/2022   TSH 2.27 10/10/2022   HGBA1C 5.4 10/10/2022   Assessment/Plan:  Hannah Best is a 42 y.o. White or Caucasian [1] female with  has a past medical history of Gestational diabetes, Herpes, Hyperlipidemia, Migraine, and Thyroid disease.  Productive cough Mild to mod, for antibx course zpack, cough med prn,  to f/u any worsening symptoms or concerns  History of gestational diabetes Lab Results  Component Value Date  HGBA1C 5.4 10/10/2022   Stable, pt to continue current medical treatment  - diet, wt control   History of hypertension BP Readings from Last 3 Encounters:  11/06/22 122/76  10/10/22  110/78  03/15/22 106/80   Stable, pt to continue medical treatment  - diet, wt control  Followup: Return if symptoms worsen or fail to improve.  Oliver Barre, MD 11/10/2022 9:25 AM [facility]

## 2022-11-06 NOTE — Patient Instructions (Signed)
Please take all new medication as prescribed - the antibiotic, and cough medicine ° °Please continue all other medications as before, and refills have been done if requested. ° °Please have the pharmacy call with any other refills you may need. ° °Please continue your efforts at being more active, low cholesterol diet, and weight control. ° °Please keep your appointments with your specialists as you may have planned ° ° ° °

## 2022-11-10 ENCOUNTER — Encounter: Payer: Self-pay | Admitting: Internal Medicine

## 2022-11-10 DIAGNOSIS — R058 Other specified cough: Secondary | ICD-10-CM | POA: Insufficient documentation

## 2022-11-10 NOTE — Assessment & Plan Note (Signed)
Mild to mod, for antibx course zpack, cough med prn,  to f/u any worsening symptoms or concerns 

## 2022-11-10 NOTE — Assessment & Plan Note (Signed)
BP Readings from Last 3 Encounters:  11/06/22 122/76  10/10/22 110/78  03/15/22 106/80   Stable, pt to continue medical treatment  - diet, wt control

## 2022-11-10 NOTE — Assessment & Plan Note (Signed)
Lab Results  Component Value Date   HGBA1C 5.4 10/10/2022   Stable, pt to continue current medical treatment  - diet, wt control

## 2022-11-14 ENCOUNTER — Other Ambulatory Visit: Payer: Self-pay | Admitting: Medical Genetics

## 2022-11-14 DIAGNOSIS — Z006 Encounter for examination for normal comparison and control in clinical research program: Secondary | ICD-10-CM

## 2022-11-15 ENCOUNTER — Other Ambulatory Visit (HOSPITAL_COMMUNITY)
Admission: RE | Admit: 2022-11-15 | Discharge: 2022-11-15 | Disposition: A | Discharge status: Home / Self Care | Payer: Managed Care, Other (non HMO) | Source: Ambulatory Visit | Attending: Oncology | Admitting: Oncology

## 2022-11-15 DIAGNOSIS — Z006 Encounter for examination for normal comparison and control in clinical research program: Secondary | ICD-10-CM | POA: Insufficient documentation

## 2022-11-26 DIAGNOSIS — Z1509 Genetic susceptibility to other malignant neoplasm: Secondary | ICD-10-CM | POA: Insufficient documentation

## 2022-11-26 DIAGNOSIS — Z1501 Genetic susceptibility to malignant neoplasm of breast: Secondary | ICD-10-CM | POA: Insufficient documentation

## 2022-11-27 LAB — HELIX MOLECULAR SCREEN: Genetic Analysis Overall Interpretation: NEGATIVE

## 2022-11-27 LAB — GENECONNECT MOLECULAR SCREEN

## 2023-02-03 ENCOUNTER — Other Ambulatory Visit: Payer: Self-pay | Admitting: Emergency Medicine

## 2023-02-06 ENCOUNTER — Telehealth: Payer: Self-pay

## 2023-02-06 ENCOUNTER — Other Ambulatory Visit: Payer: Self-pay | Admitting: Emergency Medicine

## 2023-02-06 ENCOUNTER — Encounter: Payer: Self-pay | Admitting: Radiology

## 2023-02-06 MED ORDER — LEVOTHYROXINE SODIUM 75 MCG PO TABS: 75.0000 ug | ORAL_TABLET | Freq: Every day | ORAL | 3 refills | Status: DC

## 2023-02-06 NOTE — Telephone Encounter (Signed)
Not sure why this happened.  Anyway, new prescription for levothyroxine 75 mcg sent to pharmacy of record today.  Thanks.

## 2023-02-06 NOTE — Telephone Encounter (Signed)
Copied from CRM [id number]. Topic: Clinical - Prescription Issue >> Feb 06, 2023  9:35 AM Hannah Best wrote: Reason for CRM: Patient states her pharmacy states Dr. Alvy Bimler denied her re-fill for levothyroxine (SYNTHROID) 75 MCG tablet and patient doesn't understand why. Patient is requesting Dr. Alvy Bimler to please re-fills this medication

## 2023-02-13 NOTE — Telephone Encounter (Signed)
 Copied from CRM [id number]. Topic: Clinical - Prescription Issue >> Feb 06, 2023  9:35 AM Corean SAUNDERS wrote: Reason for CRM: Patient states her pharmacy states Dr. Purcell denied her re-fill for levothyroxine  (SYNTHROID ) 75 MCG tablet and patient doesn't understand why. Patient is requesting Dr. Purcell to please re-fills this medication  ---  LVM stating rx was refilled on 1.29.25 and the pharmacy confirmed they received it. Asked her to call back if the pharmacy has told her otherwise.

## 2023-05-15 ENCOUNTER — Encounter: Payer: Self-pay | Admitting: Internal Medicine

## 2023-05-15 ENCOUNTER — Ambulatory Visit: Payer: Self-pay

## 2023-05-15 ENCOUNTER — Ambulatory Visit: Admitting: Internal Medicine

## 2023-05-15 VITALS — BP 122/78 | HR 75 | Temp 98.7°F | Ht 64.0 in | Wt 142.0 lb

## 2023-05-15 DIAGNOSIS — R070 Pain in throat: Secondary | ICD-10-CM | POA: Insufficient documentation

## 2023-05-15 MED ORDER — MELOXICAM 15 MG PO TABS: 15.0000 mg | ORAL_TABLET | Freq: Every day | ORAL | 2 refills | Status: AC | PRN

## 2023-05-15 MED ORDER — AMOXICILLIN-POT CLAVULANATE 875-125 MG PO TABS: 1.0000 | ORAL_TABLET | Freq: Two times a day (BID) | ORAL | 0 refills | Status: AC

## 2023-05-15 NOTE — Assessment & Plan Note (Addendum)
 Etiology unclear but high suspicion for left post pharyngeal infectious or inflammatory process now well defined, pt now for augmentin  bid course, mobic 15 every day prn,  and refer ENT;  pt declines CT neck for now

## 2023-05-15 NOTE — Telephone Encounter (Signed)
  Chief Complaint: sore throat Symptoms: left sided ear pain, left sided knot where tonsil would be, left ear itching, sore throat Frequency: the last few days Pertinent Negatives: Patient denies difficulty breathing, rash, headache Disposition: [] ED /[] Urgent Care (no appt availability in office) / [x] Appointment(In office/virtual)/ []  [facility]/ [] Home Care/ [] Refused Recommended Disposition /[] [facility]/ []  Follow-up with PCP Additional Notes: Patient reports she is experiencing sore throat, left ear pain, left ear itching, and a "left knot where her left tonsil should be". Patient reports she has had episodes like this in the past and the "knot" has gone away on it's own. Patient reports she has had her tonsils removed so she is unsure of what is causing it. Patient denies sob, cough, or viral symptoms. Per protocol, appt scheduled today 05/15/23. Patient  advised to call back with worsening symptoms. Patient verbalized understanding.    Copied from CRM [id number]. Topic: Clinical - Red Word Triage >> May 15, 2023 11:04 AM Orien Bird wrote: [facility] that prompted transfer to Nurse Triage: swelling in throat and very painful ear ache Reason for Disposition  Earache also present  Answer Assessment - Initial Assessment Questions 1. ONSET: "When did the throat start hurting?" (Hours or days ago)      3 days ago 2. SEVERITY: "How bad is the sore throat?" (Scale 1-10; mild, moderate or severe)   - MILD (1-3):  Doesn't interfere with eating or normal activities.   - MODERATE (4-7): Interferes with eating some solids and normal activities.   - SEVERE (8-10):  Excruciating pain, interferes with most normal activities.   - SEVERE WITH DYSPHAGIA (10): Can't swallow liquids, drooling.     mild 3. STREP EXPOSURE: "Has there been any exposure to strep within the past week?" If Yes, ask: "What type of contact occurred?"     denies 4.  VIRAL SYMPTOMS: "Are there any symptoms of a  cold, such as a runny nose, cough, hoarse voice or red eyes?"      denies 5. FEVER: "Do you have a fever?" If Yes, ask: "What is your temperature, how was it measured, and when did it start?"     denies 6. PUS ON THE TONSILS: "Is there pus on the tonsils in the back of your throat?"     Tonsils removed 7. OTHER SYMPTOMS: "Do you have any other symptoms?" (e.g., difficulty breathing, headache, rash)     denies  Protocols used: Sore Throat-A-AH

## 2023-05-15 NOTE — Patient Instructions (Addendum)
 Please take all new medication as prescribed --the antibiotic, and anti-inflammatory for pain  Please continue all other medications as before, and refills have been done if requested.  Please have the pharmacy call with any other refills you may need.  Please keep your appointments with your specialists as you may have planned  You will be contacted regarding the referral for: ENT

## 2023-05-15 NOTE — Progress Notes (Signed)
 Patient ID: Hannah Best, female   DOB: 10-Mar-1980, 43 y.o.   MRN: [id number]        Chief Complaint: follow up left post throat pain once per yr x 4 yrs       HPI:  Hannah Best is a 43 y.o. female here with c/o swollen area behind the left tonsillar pillar with pain, and swelling of the left lower face mostly near the angle of the jaw.  Seems to happen once yearly, better with antibiotic.  Saw ENT once but did not require tx apparently.  Willing to see again.  Pt denies chest pain, increased sob or doe, wheezing, orthopnea, PND, increased LE swelling, palpitations, dizziness or syncope.   Pt denies polydipsia, polyuria, or new focal neuro s/s.          Wt Readings from Last 3 Encounters:  05/15/23 142 lb (64.4 kg)  11/06/22 142 lb (64.4 kg)  10/10/22 142 lb (64.4 kg)   BP Readings from Last 3 Encounters:  05/15/23 122/78  11/06/22 122/76  10/10/22 110/78         Past Medical History:  Diagnosis Date   Gestational diabetes    diet controlled   Herpes    Hyperlipidemia    Migraine    Thyroid  disease    Past Surgical History:  Procedure Laterality Date   BREAST BIOPSY Bilateral    2 in L, 1 in R   CESAREAN SECTION N/A 08/09/2015   Procedure: CESAREAN SECTION;  Surgeon: Luan Rumpf, MD;  Location: [facility];  Service: Obstetrics;  Laterality: N/A;   COLONOSCOPY     Gum Graft     01/2022   septorhinoplasty     thrombosed hemorrhoid     TONSILLECTOMY     TONSILLECTOMY AND ADENOIDECTOMY     TURBINATE REDUCTION     WISDOM TOOTH EXTRACTION      reports that she has quit smoking. Her smoking use included cigarettes. She has a 0.2 pack-year smoking history. She has never used smokeless tobacco. She reports current alcohol use of about 1.0 standard drink of alcohol per week. She reports that she does not use drugs. family history includes Allergic rhinitis in her father and son; Breast cancer in her maternal aunt, maternal grandmother, mother, and  paternal grandmother; Cancer in her maternal aunt and maternal grandmother; Diabetes in her father, paternal aunt, paternal grandmother, and paternal uncle; Heart attack in her paternal grandmother; Heart disease in her maternal grandmother; Hyperlipidemia in her father, maternal grandfather, maternal grandmother, mother, and sister; Hypertension in her father, maternal grandmother, and sister; Other in her sister; Stroke in her maternal grandmother. Allergies  Allergen Reactions   Latex Anaphylaxis   Shea Butter Anaphylaxis   Ciprofloxacin Other (See Comments)    Muscle spasms in legs, pt states she couldn't walk   Dust Mite Extract    Hydrocodone Nausea And Vomiting   Hydrocodone-Acetaminophen  Nausea And Vomiting   Hydromorphone Nausea And Vomiting   Oxycodone  Nausea And Vomiting   Current Outpatient Medications on File Prior to Visit  Medication Sig Dispense Refill   B Complex Vitamins (VITAMIN B-COMPLEX) TABS Take 1 tablet by mouth.     Cholecalciferol 25 MCG (1000 UT) tablet Take by mouth.     EPINEPHrine  0.3 mg/0.3 mL IJ SOAJ injection Inject 0.3 mg into the muscle as needed for anaphylaxis. 1 each 0   Ferrous Sulfate (IRON PO) Take 1 tablet by mouth.     Fezolinetant 45 MG TABS Take  45 mg by mouth.     ibuprofen  (ADVIL ) 600 MG tablet Take 600 mg by mouth every 6 (six) hours as needed.     levothyroxine  (SYNTHROID ) 75 MCG tablet Take 1 tablet (75 mcg total) by mouth daily before breakfast. 90 tablet 3   ondansetron  (ZOFRAN ) 4 MG tablet Take 4 mg by mouth every 8 (eight) hours as needed.     polyethylene glycol powder (GLYCOLAX/MIRALAX) 17 GM/SCOOP powder SMARTSIG:17 Gram(s) By Mouth Daily PRN     Prenatal Vit-Fe Fumarate-FA (MULTIVITAMIN-PRENATAL) 27-0.8 MG TABS tablet Take 1 tablet by mouth daily at 12 noon.     promethazine -dextromethorphan (PROMETHAZINE -DM) 6.25-15 MG/5ML syrup Take 5 mLs by mouth 4 (four) times daily as needed. 180 mL 0   traMADol (ULTRAM) 50 MG tablet Take 50 mg  by mouth every 6 (six) hours as needed.     valACYclovir  (VALTREX ) 1000 MG tablet Take 2 tablets (2,000 mg total) by mouth 2 (two) times daily. Take for 1 day for outbreak of cold sores 12 tablet 5   No current facility-administered medications on file prior to visit.        ROS:  All others reviewed and negative.  Objective        PE:  BP 122/78 (BP Location: Right Arm, Patient Position: Sitting, Cuff Size: Normal)   Pulse 75   Temp 98.7 F (37.1 C) (Oral)   Ht 5\' 4"  (1.626 m)   Wt 142 lb (64.4 kg)   SpO2 98%   Breastfeeding Unknown   BMI 24.37 kg/m                 Constitutional: Pt appears in NAD               HENT: Head: NCAT.                Right Ear: External ear normal.                 Left Ear: External ear normal.                Eyes: . Pupils are equal, round, and reactive to light. Conjunctivae and EOM are normal; pharynx with vague erythema swelling left tonsillar pillar area and mild tender left face at the angle of the jaw               Nose: without d/c or deformity               Neck: Neck supple. Gross normal ROM               Cardiovascular: Normal rate and regular rhythm.                 Pulmonary/Chest: Effort normal and breath sounds without rales or wheezing.                               Neurological: Pt is alert. At baseline orientation, motor grossly intact               Skin: Skin is warm. No rashes, no other new lesions, LE edema - none               Psychiatric: Pt behavior is normal without agitation   Micro: none  Cardiac tracings I have personally interpreted today:  none  Pertinent Radiological findings (summarize): none   Lab Results  Component Value Date   WBC 5.5  10/10/2022   HGB 13.6 10/10/2022   HCT 40.5 10/10/2022   PLT 228.0 10/10/2022   GLUCOSE 105 (H) 10/10/2022   CHOL 229 (H) 10/10/2022   TRIG 88.0 10/10/2022   HDL 69.00 10/10/2022   LDLCALC 142 (H) 10/10/2022   ALT 11 10/10/2022   AST 21 10/10/2022   NA 139 10/10/2022    K 3.9 10/10/2022   CL 103 10/10/2022   CREATININE 0.88 10/10/2022   BUN 12 10/10/2022   CO2 26 10/10/2022   TSH 2.27 10/10/2022   HGBA1C 5.4 10/10/2022   Assessment/Plan:  Hannah Best is a 43 y.o. White or Caucasian [1] female with  has a past medical history of Gestational diabetes, Herpes, Hyperlipidemia, Migraine, and Thyroid  disease.  Throat pain Etiology unclear but high suspicion for left post pharyngeal infectious or inflammatory process now well defined, pt now for augmentin  bid course, mobic 15 every day prn,  and refer ENT;  pt declines CT neck for now  Followup: Return if symptoms worsen or fail to improve.  Rosalia Colonel, MD 05/15/2023 8:06 PM [facility]  Primary Care - [facility]

## 2023-07-26 ENCOUNTER — Encounter: Payer: Self-pay | Admitting: Advanced Practice Midwife

## 2023-10-01 ENCOUNTER — Encounter: Payer: Self-pay | Admitting: Emergency Medicine

## 2023-10-01 ENCOUNTER — Ambulatory Visit: Admitting: Emergency Medicine

## 2023-10-01 VITALS — BP 110/78 | HR 94 | Temp 98.2°F | Ht 64.0 in | Wt 145.0 lb

## 2023-10-01 DIAGNOSIS — L80 Vitiligo: Secondary | ICD-10-CM | POA: Diagnosis not present

## 2023-10-01 DIAGNOSIS — E039 Hypothyroidism, unspecified: Secondary | ICD-10-CM | POA: Diagnosis not present

## 2023-10-01 DIAGNOSIS — M2550 Pain in unspecified joint: Secondary | ICD-10-CM | POA: Diagnosis not present

## 2023-10-01 DIAGNOSIS — M790 Rheumatism, unspecified: Secondary | ICD-10-CM | POA: Insufficient documentation

## 2023-10-01 LAB — COMPREHENSIVE METABOLIC PANEL WITH GFR
ALT: 13 U/L (ref 0–35)
AST: 22 U/L (ref 0–37)
Albumin: 4.9 g/dL (ref 3.5–5.2)
Alkaline Phosphatase: 75 U/L (ref 39–117)
BUN: 15 mg/dL (ref 6–23)
CO2: 32 meq/L (ref 19–32)
Calcium: 10.1 mg/dL (ref 8.4–10.5)
Chloride: 101 meq/L (ref 96–112)
Creatinine, Ser: 1.04 mg/dL (ref 0.40–1.20)
GFR: 65.24 mL/min (ref >60.00)
Glucose, Bld: 94 mg/dL (ref 70–99)
Potassium: 3.8 meq/L (ref 3.5–5.1)
Sodium: 141 meq/L (ref 135–145)
Total Bilirubin: 0.6 mg/dL (ref 0.2–1.2)
Total Protein: 7.8 g/dL (ref 6.0–8.3)

## 2023-10-01 LAB — CBC WITH DIFFERENTIAL/PLATELET
Basophils Absolute: 0.1 K/uL (ref 0.0–0.1)
Basophils Relative: 0.9 % (ref 0.0–3.0)
Eosinophils Absolute: 0.2 K/uL (ref 0.0–0.7)
Eosinophils Relative: 3.5 % (ref 0.0–5.0)
HCT: 39.7 % (ref 36.0–46.0)
Hemoglobin: 13.5 g/dL (ref 12.0–15.0)
Lymphocytes Relative: 30 % (ref 12.0–46.0)
Lymphs Abs: 1.7 K/uL (ref 0.7–4.0)
MCHC: 34 g/dL (ref 30.0–36.0)
MCV: 87.9 fl (ref 78.0–100.0)
Monocytes Absolute: 0.3 K/uL (ref 0.1–1.0)
Monocytes Relative: 5.6 % (ref 3.0–12.0)
Neutro Abs: 3.4 K/uL (ref 1.4–7.7)
Neutrophils Relative %: 60 % (ref 43.0–77.0)
Platelets: 229 K/uL (ref 150.0–400.0)
RBC: 4.52 Mil/uL (ref 3.87–5.11)
RDW: 12.7 % (ref 11.5–15.5)
WBC: 5.6 K/uL (ref 4.0–10.5)

## 2023-10-01 LAB — TSH: TSH: 1.88 u[IU]/mL (ref 0.35–5.50)

## 2023-10-01 LAB — HEMOGLOBIN A1C: Hgb A1c MFr Bld: 6 % (ref 4.6–6.5)

## 2023-10-01 LAB — C-REACTIVE PROTEIN: CRP: <1 mg/dL (ref 0.5–20.0)

## 2023-10-01 LAB — SEDIMENTATION RATE: Sed Rate: 21 mm/h — ABNORMAL HIGH (ref 0–20)

## 2023-10-01 LAB — FERRITIN: Ferritin: 101.2 ng/mL (ref 10.0–291.0)

## 2023-10-01 NOTE — Assessment & Plan Note (Signed)
Clinically euthyroid.  Presently taking 75 mcg of Synthroid. ?Patient states she was taking 25 mcg before. ?We will check thyroid profile today and adjust dose accordingly. ?

## 2023-10-01 NOTE — Progress Notes (Signed)
 Hannah Best 43 y.o.   Chief Complaint  Patient presents with   Referral    Patient here for referral for rheumatology .Hannah Best She had her ovaviers removed in December and has been having bilateral hand pain and walking hurts she doesn't know if it this could be related.     HISTORY OF PRESENT ILLNESS: This is a 43 y.o. female complaining of intermittent pains to different joints for several months Has history of hypothyroidism and vitiligo Needs referral for rheumatologist No other complaints or medical concerns today.   HPI   Prior to Admission medications   Medication Sig Start Date End Date Taking? Authorizing Provider  amoxicillin -clavulanate (AUGMENTIN ) 875-125 MG tablet Take 1 tablet by mouth 2 (two) times daily. 05/15/23   Hannah Lynwood ORN, MD  B Complex Vitamins (VITAMIN B-COMPLEX) TABS Take 1 tablet by mouth.    [provider]  Cholecalciferol 25 MCG (1000 UT) tablet Take by mouth.    [provider]  EPINEPHrine  0.3 mg/0.3 mL IJ SOAJ injection Inject 0.3 mg into the muscle as needed for anaphylaxis. 06/02/21   Banister, Hannah K, MD  Ferrous Sulfate (IRON PO) Take 1 tablet by mouth.    [provider]  Fezolinetant 45 MG TABS Take 45 mg by mouth. 01/11/23   [provider]  ibuprofen  (ADVIL ) 600 MG tablet Take 600 mg by mouth every 6 (six) hours as needed. 12/20/22   [provider]  levothyroxine  (SYNTHROID ) 75 MCG tablet Take 1 tablet (75 mcg total) by mouth daily before breakfast. 02/06/23   Hannah Emil Schanz, MD  meloxicam  (MOBIC ) 15 MG tablet Take 1 tablet (15 mg total) by mouth daily as needed for pain. 05/15/23   Hannah Lynwood ORN, MD  ondansetron  (ZOFRAN ) 4 MG tablet Take 4 mg by mouth every 8 (eight) hours as needed. 12/20/22   [provider]  polyethylene glycol powder (GLYCOLAX/MIRALAX) 17 GM/SCOOP powder SMARTSIG:17 Gram(s) By Mouth Daily PRN 12/20/22   [provider]  Prenatal Vit-Fe Fumarate-FA  (MULTIVITAMIN-PRENATAL) 27-0.8 MG TABS tablet Take 1 tablet by mouth daily at 12 noon.    [provider]  promethazine -dextromethorphan (PROMETHAZINE -DM) 6.25-15 MG/5ML syrup Take 5 mLs by mouth 4 (four) times daily as needed. 11/06/22   Hannah Lynwood ORN, MD  traMADol (ULTRAM) 50 MG tablet Take 50 mg by mouth every 6 (six) hours as needed. 12/20/22   [provider]  valACYclovir  (VALTREX ) 1000 MG tablet Take 2 tablets (2,000 mg total) by mouth 2 (two) times daily. 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   Maternal age 33+, multigravida, antepartum 07/21/2021   History of cesarean section 07/21/2021   History of hypertension 07/21/2021   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   Vitiligo 10/06/2020   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   History of gestational diabetes 10/06/2020   First degree hemorrhoids 03/15/2020   Hypothyroidism 10/19/2019   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   Family  history of BRCA gene mutation 07/23/2018   Hyperlipidemia 07/28/2015   IBS (irritable bowel syndrome) 07/28/2015    Past Medical History:  Diagnosis Date   Gestational diabetes    diet controlled   Herpes    Hyperlipidemia    Migraine    Thyroid  disease     Past Surgical History:  Procedure Laterality Date   BREAST BIOPSY Bilateral    2 in L, 1 in R   CESAREAN SECTION N/A 08/09/2015   Procedure: CESAREAN SECTION;  Surgeon: Jolene Gaskins, MD;  Location: [facility];  Service: Obstetrics;   Laterality: N/A;   COLONOSCOPY     Gum Graft     01/2022   septorhinoplasty     thrombosed hemorrhoid     TONSILLECTOMY     TONSILLECTOMY AND ADENOIDECTOMY     TURBINATE REDUCTION     WISDOM TOOTH EXTRACTION      Social History   Socioeconomic History   Marital status: Married    Spouse name: Not on file   Number of children: 2   Years of education: 14   Highest education level: Associate degree: occupational, Scientist, product/process development, or vocational program  Occupational History   Not on file  Tobacco Use   Smoking status: Former    Current packs/day: 0.15    Average packs/day: 0.2 packs/day for 1 year (0.2 ttl pk-yrs)    Types: Cigarettes   Smokeless tobacco: Never   Tobacco comments:    in highschool  Vaping Use   Vaping status: Never Used  Substance and Sexual Activity   Alcohol use: Yes    Alcohol/week: 1.0 standard drink of alcohol    Types: 1 Standard drinks or equivalent per week    Comment: Social   Drug use: No   Sexual activity: Yes  Other Topics Concern   Not on file  Social History Narrative   Lives at home with husband & children   Right handed   Caffeine: 1 cup daily   Social Drivers of Corporate investment banker Strain: Low Risk  (10/09/2022)   Overall Financial Resource Strain (CARDIA)    Difficulty of Paying Living Expenses: Not very hard  Recent Concern: Financial Resource Strain - Medium Risk (08/20/2022)   Received from [facility]   Overall Financial Resource Strain (CARDIA)    Difficulty of Paying Living Expenses: Somewhat hard  Food Insecurity: Patient Declined (10/09/2022)   Hunger Vital Sign    Worried About Programme researcher, broadcasting/film/video in the Last Year: Patient declined    Ran Out of Food in the Last Year: Patient declined  Recent Concern: Food Insecurity - Food Insecurity Present (08/20/2022)   [facility]   Hunger Vital Sign    Within the past 12 months, you worried that your food would run out before you got the money to buy more.:  Sometimes true    Within the past 12 months, the food you bought just didn't last and you didn't have money to get more.: Sometimes true  Transportation Needs: No Transportation Needs (10/09/2022)   PRAPARE - Administrator, Civil Service (Medical): No    Lack of Transportation (Non-Medical): No  Physical Activity: Insufficiently Active (10/09/2022)   Exercise Vital Sign    Days of Exercise per Week: 5 days    Minutes of Exercise per Session: 20 min  Stress: Stress Concern Present (10/09/2022)   [employer] of Occupational Health - Occupational Stress Questionnaire    Feeling of Stress :  To some extent  Social Connections: Socially Integrated (10/09/2022)   Social Connection and Isolation Panel    Frequency of Communication with Friends and Family: Twice a week    Frequency of Social Gatherings with Friends and Family: Once a week    Attends Religious Services: More than 4 times per year    Active Member of [employer] or Organizations: Yes    Attends Engineer, structural: More than 4 times per year    Marital Status: Married  Catering manager Violence: Not At Risk (10/15/2022)   Received from [facility]   HITS    Over the last 12 months how often did your partner physically hurt you?: Never    Over the last 12 months how often did your partner insult you or talk down to you?: Never    Over the last 12 months how often did your partner threaten you with physical harm?: Never    Over the last 12 months how often did your partner scream or curse at you?: Never    Family History  Problem Relation Age of Onset   Breast cancer Mother    Hyperlipidemia Mother    Diabetes Father    Hypertension Father    Allergic rhinitis Father    Hyperlipidemia Father    Hypertension Sister    Hyperlipidemia Sister    Other Sister        thyroid  issue   Cancer Maternal Aunt        breast   Breast cancer Maternal Aunt    Diabetes Paternal Aunt    Diabetes Paternal Uncle     Cancer Maternal Grandmother        breast   Hypertension Maternal Grandmother    Heart disease Maternal Grandmother    Stroke Maternal Grandmother    Hyperlipidemia Maternal Grandmother    Breast cancer Maternal Grandmother    Hyperlipidemia Maternal Grandfather    Diabetes Paternal Grandmother    Heart attack Paternal Grandmother    Breast cancer Paternal Grandmother    Allergic rhinitis Son    Colon cancer Neg Hx    Ovarian cancer Neg Hx    Pancreatic cancer Neg Hx    Prostate cancer Neg Hx      Review of Systems  Constitutional: Negative.  Negative for chills and fever.  HENT: Negative.  Negative for congestion and sore throat.   Respiratory: Negative.  Negative for cough and shortness of breath.   Cardiovascular: Negative.  Negative for chest pain and palpitations.  Gastrointestinal:  Negative for abdominal pain, diarrhea, nausea and vomiting.  Genitourinary: Negative.  Negative for dysuria and hematuria.  Musculoskeletal:  Positive for joint pain.  Skin: Negative.  Negative for rash.  Neurological: Negative.  Negative for dizziness and headaches.  All other systems reviewed and are negative.   Today's Vitals   10/01/23 1357  BP: 110/78  Pulse: 94  Temp: 98.2 F (36.8 C)  TempSrc: Oral  SpO2: 97%  Weight: 145 lb (65.8 kg)  Height: 5' 4 (1.626 m)   Body mass index is 24.89 kg/m.   Physical Exam Vitals reviewed.  Constitutional:      Appearance: Normal appearance.  HENT:     Head: Normocephalic.  Eyes:     Extraocular Movements: Extraocular movements intact.  Cardiovascular:     Rate and Rhythm: Normal rate.  Pulmonary:     Effort: Pulmonary effort is normal.  Musculoskeletal:        General: No swelling, tenderness or deformity.  Skin:    General: Skin is warm and dry.  Neurological:     Mental Status: She is alert and oriented to person, place, and time.  Psychiatric:        Mood and Affect: Mood normal.        Behavior: Behavior normal.       ASSESSMENT & PLAN: Problem List Items Addressed This Visit       Endocrine   Hypothyroidism   Clinically euthyroid.  Presently taking 75 mcg of Synthroid . Patient states she was taking 25 mcg before. We will check thyroid  profile today and adjust dose accordingly.      Relevant Orders   TSH     Musculoskeletal and Integument   Vitiligo   Rheumatism - Primary   Pain to multiple different joints No synovitis on physical examination Differential diagnosis discussed including possibility of RA and lupus Has history of hypothyroidism and vitiligo.  Most likely has autoimmune condition. Recommend rheumatology evaluation Blood work done today Referral placed today      Relevant Orders   ANA,IFA RA Diag Pnl w/rflx Tit/Patn   Ferritin   Sedimentation rate   C-reactive protein   CBC with Differential/Platelet   Comprehensive metabolic panel with GFR   TSH   Hemoglobin A1c     Other   Arthralgia of multiple joints   Presently no signs of synovitis on physical examination Recommend blood work today including inflammatory markers Referral to rheumatology placed today Pain management discussed      Relevant Orders   ANA,IFA RA Diag Pnl w/rflx Tit/Patn   Ferritin   Sedimentation rate   C-reactive protein   CBC with Differential/Platelet   Comprehensive metabolic panel with GFR   TSH   Hemoglobin A1c   Patient Instructions  Health Maintenance, Female Adopting a healthy lifestyle and getting preventive care are important in promoting health and wellness. Ask your health care provider about: The right schedule for you to have regular tests and exams. Things you can do on your own to prevent diseases and keep yourself healthy. What should I know about diet, weight, and exercise? Eat a healthy diet  Eat a diet that includes plenty of vegetables, fruits, low-fat dairy products, and lean protein. Do not eat a lot of foods that are high in solid fats, added sugars,  or sodium. Maintain a healthy weight Body mass index (BMI) is used to identify weight problems. It estimates body fat based on height and weight. Your health care provider can help determine your BMI and help you achieve or maintain a healthy weight. Get regular exercise Get regular exercise. This is one of the most important things you can do for your health. Most adults should: Exercise for at least 150 minutes each week. The exercise should increase your heart rate and make you sweat (moderate-intensity exercise). Do strengthening exercises at least twice a week. This is in addition to the moderate-intensity exercise. Spend less time sitting. Even light physical activity can be beneficial. Watch cholesterol and blood lipids Have your blood tested for lipids and cholesterol at 45 years of age, then have this test every 5 years. Have your cholesterol levels checked more often if: Your lipid or cholesterol levels are high. You are older than 45 years of age. You are at high risk for heart disease. What should I know about cancer screening? Depending on your health history and family history, you may need to have cancer screening at various ages. This may include screening for: Breast  cancer. Cervical cancer. Colorectal cancer. Skin cancer. Lung cancer. What should I know about heart disease, diabetes, and high blood pressure? Blood pressure and heart disease High blood pressure causes heart disease and increases the risk of stroke. This is more likely to develop in people who have high blood pressure readings or are overweight. Have your blood pressure checked: Every 3-5 years if you are 24-73 years of age. Every year if you are 75 years old or older. Diabetes Have regular diabetes screenings. This checks your fasting blood sugar level. Have the screening done: Once every three years after age 30 if you are at a normal weight and have a low risk for diabetes. More often and at a younger  age if you are overweight or have a high risk for diabetes. What should I know about preventing infection? Hepatitis B If you have a higher risk for hepatitis B, you should be screened for this virus. Talk with your health care provider to find out if you are at risk for hepatitis B infection. Hepatitis C Testing is recommended for: Everyone born from [birth year] through 1967. Anyone with known risk factors for hepatitis C. Sexually transmitted infections (STIs) Get screened for STIs, including gonorrhea and chlamydia, if: You are sexually active and are younger than 45 years of age. You are older than 45 years of age and your health care provider tells you that you are at risk for this type of infection. Your sexual activity has changed since you were last screened, and you are at increased risk for chlamydia or gonorrhea. Ask your health care provider if you are at risk. Ask your health care provider about whether you are at high risk for HIV. Your health care provider may recommend a prescription medicine to help prevent HIV infection. If you choose to take medicine to prevent HIV, you should first get tested for HIV. You should then be tested every 3 months for as long as you are taking the medicine. Pregnancy If you are about to stop having your period (premenopausal) and you may become pregnant, seek counseling before you get pregnant. Take 400 to 800 micrograms (mcg) of folic acid every day if you become pregnant. Ask for birth control (contraception) if you want to prevent pregnancy. Osteoporosis and menopause Osteoporosis is a disease in which the bones lose minerals and strength with aging. This can result in bone fractures. If you are 24 years old or older, or if you are at risk for osteoporosis and fractures, ask your health care provider if you should: Be screened for bone loss. Take a calcium or vitamin D supplement to lower your risk of fractures. Be given hormone replacement therapy  (HRT) to treat symptoms of menopause. Follow these instructions at home: Alcohol use Do not drink alcohol if: Your health care provider tells you not to drink. You are pregnant, may be pregnant, or are planning to become pregnant. If you drink alcohol: Limit how much you have to: 0-1 drink a day. Know how much alcohol is in your drink. In the [place] one drink equals one 12 oz bottle of beer (355 mL), one 5 oz glass of wine (148 mL), or one 1 oz glass of hard liquor (44 mL). Lifestyle Do not use any products that contain nicotine or tobacco. These products include cigarettes, chewing tobacco, and vaping devices, such as e-cigarettes. If you need help quitting, ask your health care provider. Do not use street drugs. Do not share needles. Ask your health care  provider for help if you need support or information about quitting drugs. General instructions Schedule regular health, dental, and eye exams. Stay current with your vaccines. Tell your health care provider if: You often feel depressed. You have ever been abused or do not feel safe at home. Summary Adopting a healthy lifestyle and getting preventive care are important in promoting health and wellness. Follow your health care provider's instructions about healthy diet, exercising, and getting tested or screened for diseases. Follow your health care provider's instructions on monitoring your cholesterol and blood pressure. This information is not intended to replace advice given to you by your health care provider. Make sure you discuss any questions you have with your health care provider. Document Revised: 05/16/2020 Document Reviewed: 05/16/2020 Elsevier Patient Education  2024 Elsevier Inc.    Emil Schaumann, MD [facility]

## 2023-10-01 NOTE — Assessment & Plan Note (Signed)
 Pain to multiple different joints No synovitis on physical examination Differential diagnosis discussed including possibility of RA and lupus Has history of hypothyroidism and vitiligo.  Most likely has autoimmune condition. Recommend rheumatology evaluation Blood work done today Referral placed today

## 2023-10-01 NOTE — Assessment & Plan Note (Signed)
 Presently no signs of synovitis on physical examination Recommend blood work today including inflammatory markers Referral to rheumatology placed today Pain management discussed

## 2023-10-01 NOTE — Patient Instructions (Signed)

## 2023-10-02 ENCOUNTER — Ambulatory Visit: Payer: Self-pay | Admitting: Emergency Medicine

## 2023-10-02 DIAGNOSIS — M790 Rheumatism, unspecified: Secondary | ICD-10-CM

## 2023-10-02 DIAGNOSIS — M2550 Pain in unspecified joint: Secondary | ICD-10-CM

## 2023-10-02 DIAGNOSIS — R768 Other specified abnormal immunological findings in serum: Secondary | ICD-10-CM

## 2023-10-03 LAB — ANTI-NUCLEAR AB-TITER (ANA TITER)
ANA TITER: 1:160 {titer} — ABNORMAL HIGH
ANA Titer 1: 1:160 {titer} — ABNORMAL HIGH

## 2023-10-03 LAB — ANA,IFA RA DIAG PNL W/RFLX TIT/PATN
Anti Nuclear Antibody (ANA): POSITIVE — AB
Cyclic Citrullin Peptide Ab: <16 U
Rheumatoid fact SerPl-aCnc: <10 [IU]/mL (ref <14)

## 2023-10-24 ENCOUNTER — Ambulatory Visit (INDEPENDENT_AMBULATORY_CARE_PROVIDER_SITE_OTHER): Admitting: Otolaryngology

## 2023-10-24 VITALS — BP 113/78 | HR 81 | Temp 98.0°F | Ht 64.0 in | Wt 145.0 lb

## 2023-10-24 DIAGNOSIS — J3089 Other allergic rhinitis: Secondary | ICD-10-CM

## 2023-10-24 DIAGNOSIS — K219 Gastro-esophageal reflux disease without esophagitis: Secondary | ICD-10-CM | POA: Diagnosis not present

## 2023-10-24 DIAGNOSIS — R0982 Postnasal drip: Secondary | ICD-10-CM | POA: Diagnosis not present

## 2023-10-24 DIAGNOSIS — R070 Pain in throat: Secondary | ICD-10-CM

## 2023-10-24 DIAGNOSIS — R09A2 Foreign body sensation, throat: Secondary | ICD-10-CM | POA: Diagnosis not present

## 2023-10-24 DIAGNOSIS — R0981 Nasal congestion: Secondary | ICD-10-CM | POA: Diagnosis not present

## 2023-10-24 MED ORDER — FLUTICASONE PROPIONATE 50 MCG/ACT NA SUSP
2.0000 | Freq: Two times a day (BID) | NASAL | 6 refills | Status: AC
Start: 2023-10-24 — End: ?

## 2023-10-24 MED ORDER — FAMOTIDINE 20 MG PO TABS: 20.0000 mg | ORAL_TABLET | Freq: Two times a day (BID) | ORAL | 3 refills | Status: AC

## 2023-10-24 MED ORDER — LEVOCETIRIZINE DIHYDROCHLORIDE 5 MG PO TABS: 5.0000 mg | ORAL_TABLET | Freq: Every evening | ORAL | 3 refills | Status: AC

## 2023-10-24 NOTE — Patient Instructions (Signed)

## 2023-10-24 NOTE — Progress Notes (Signed)
 ENT CONSULT:  Reason for Consult: chronic throat pain    HPI: Discussed the use of AI scribe software for clinical note transcription with the patient, who gave verbal consent to proceed.  History of Present Illness Hannah Best is a 43 year old female who presents with chronic intermittent throat discomfort along the left side of her throat.  She has been experiencing chronic throat discomfort on and off, primarily on the left side, described as a sensation of swelling that feels 'big'. This occurs approximately once or twice a year, often during allergy seasons, with the last episode occurring about six months ago.  She has a history of allergies, particularly to dust, and experiences heartburn symptoms. She has not tried taking an allergy pill and Pepcid  during episodes of throat swelling.  She underwent a tonsillectomy, septal rhinoplasty, turbinate reduction, and adenoidectomy at the age of 62. She has consulted with a dentist and an ENT regarding her symptoms. The dentist suspected an abscess and prescribed antibiotics, which she completed. Imaging of her teeth was performed, and a CT scan of the brain was done due to ocular migraines, which showed no abnormalities in the neck.  She has a history of vitiligo and is concerned about the possibility of autoimmunity affecting her salivary glands. She notes that the swelling occurs in the area of the retromolar trigone, which is not where her tonsils were located.  No current throat swelling. Reports heartburn symptoms.   Records Reviewed:  Office visit 05/15/23 Dr Lynwood Setter Calise Dunckel is a 43 y.o. female here with c/o swollen area behind the left tonsillar pillar with pain, and swelling of the left lower face mostly near the angle of the jaw.  Seems to happen once yearly, better with antibiotic.  Saw ENT once but did not require tx apparently.  Willing to see again.  Pt denies chest pain, increased sob or doe, wheezing,  orthopnea, PND, increased LE swelling, palpitations, dizziness or syncope.   Pt denies polydipsia, polyuria, or new focal neuro s/s.  Throat pain Etiology unclear but high suspicion for left post pharyngeal infectious or inflammatory process now well defined, pt now for augmentin  bid course, mobic  15 every day prn,  and refer ENT;  pt declines CT neck for now.       Past Medical History:  Diagnosis Date   Gestational diabetes    diet controlled   Herpes    Hyperlipidemia    Migraine    Thyroid  disease     Past Surgical History:  Procedure Laterality Date   BREAST BIOPSY Bilateral    2 in L, 1 in R   CESAREAN SECTION N/A 08/09/2015   Procedure: CESAREAN SECTION;  Surgeon: Jolene Gaskins, MD;  Location: [facility];  Service: Obstetrics;  Laterality: N/A;   COLONOSCOPY     Gum Graft     01/2022   septorhinoplasty     thrombosed hemorrhoid     TONSILLECTOMY     TONSILLECTOMY AND ADENOIDECTOMY     TURBINATE REDUCTION     WISDOM TOOTH EXTRACTION      Family History  Problem Relation Age of Onset   Breast cancer Mother    Hyperlipidemia Mother    Diabetes Father    Hypertension Father    Allergic rhinitis Father    Hyperlipidemia Father    Hypertension Sister    Hyperlipidemia Sister    Other Sister        thyroid  issue   Cancer Maternal Aunt  breast   Breast cancer Maternal Aunt    Diabetes Paternal Aunt    Diabetes Paternal Uncle    Cancer Maternal Grandmother        breast   Hypertension Maternal Grandmother    Heart disease Maternal Grandmother    Stroke Maternal Grandmother    Hyperlipidemia Maternal Grandmother    Breast cancer Maternal Grandmother    Hyperlipidemia Maternal Grandfather    Diabetes Paternal Grandmother    Heart attack Paternal Grandmother    Breast cancer Paternal Grandmother    Allergic rhinitis Son    Colon cancer Neg Hx    Ovarian cancer Neg Hx    Pancreatic cancer Neg Hx    Prostate cancer Neg Hx     Social History:   reports that she has quit smoking. Her smoking use included cigarettes. She has a 0.2 pack-year smoking history. She has never used smokeless tobacco. She reports current alcohol use of about 1.0 standard drink of alcohol per week. She reports that she does not use drugs.  Allergies:  Allergies  Allergen Reactions   Latex Anaphylaxis   Shea Butter Anaphylaxis   Ciprofloxacin Other (See Comments)    Muscle spasms in legs, pt states she couldn't walk   Dust Mite Extract    Hydrocodone Nausea And Vomiting   Hydrocodone-Acetaminophen  Nausea And Vomiting   Hydromorphone Nausea And Vomiting   Oxycodone  Nausea And Vomiting    Medications: I have reviewed the patient's current medications.  The PMH, PSH, Medications, Allergies, and SH were reviewed and updated.  ROS: Constitutional: Negative for fever, weight loss and weight gain. Cardiovascular: Negative for chest pain and dyspnea on exertion. Respiratory: Is not experiencing shortness of breath at rest. Gastrointestinal: Negative for nausea and vomiting. Neurological: Negative for headaches. Psychiatric: The patient is not nervous/anxious  Blood pressure 113/78, pulse 81, temperature 98 F (36.7 C), temperature source Oral, height 5' 4 (1.626 m), weight 145 lb (65.8 kg), SpO2 97%, unknown if currently breastfeeding. Body mass index is 24.89 kg/m.  PHYSICAL EXAM:  Exam: General: Well-developed, well-nourished Communication and Voice: Clear pitch and clarity Respiratory Respiratory effort: Equal inspiration and expiration without stridor Cardiovascular Peripheral Vascular: Warm extremities with equal color/perfusion Eyes: No nystagmus with equal extraocular motion bilaterally Neuro/Psych/Balance: Patient oriented to person, place, and time; Appropriate mood and affect; Gait is intact with no imbalance; Cranial nerves I-XII are intact Head and Face Inspection: Normocephalic and atraumatic without mass or lesion Palpation: Facial  skeleton intact without bony stepoffs Salivary Glands: No mass or tenderness Facial Strength: Facial motility symmetric and full bilaterally ENT Pinna: External ear intact and fully developed External canal: Canal is patent with intact skin Tympanic Membrane: Clear and mobile External Nose: No scar or anatomic deformity Mouth/Oral cavity: area of concern is just posterior to RTM along lateral soft palate without masses or lesions or swelling  Tonsils are absent Neck Neck and Trachea: Midline trachea without mass or lesion Thyroid : No mass or nodularity Lymphatics: No lymphadenopathy  Procedure: Preoperative diagnosis: chronic throat discomfort   Postoperative diagnosis:   Same  Procedure: Flexible fiberoptic laryngoscopy  Surgeon: Elena Larry, MD  Anesthesia: Topical lidocaine  and Afrin Complications: None Condition is stable throughout exam  Indications and consent:  The patient presents to the clinic with above symptoms. Indirect laryngoscopy view was incomplete. Thus it was recommended that they undergo a flexible fiberoptic laryngoscopy. All of the risks, benefits, and potential complications were reviewed with the patient preoperatively and verbal informed consent was obtained.  Procedure: The patient was seated upright in the clinic. Topical lidocaine  and Afrin were applied to the nasal cavity. After adequate anesthesia had occurred, I then proceeded to pass the flexible telescope into the nasal cavity. The nasal cavity was patent without rhinorrhea or polyp. The nasopharynx was also patent without mass or lesion. The base of tongue was visualized and was normal. There were no signs of pooling of secretions in the piriform sinuses. The true vocal folds were mobile bilaterally. There were no signs of glottic or supraglottic mucosal lesion or mass. There was moderate interarytenoid pachydermia and post cricoid edema. The telescope was then slowly withdrawn and the patient  tolerated the procedure throughout.   Studies Reviewed: CT Angio of the head and neck 12/13/2017 IMPRESSION: Normal CTA head and neck.   Negative for cerebral aneurysm   Negative for atherosclerotic disease dissection or stenosis  MRI brain 11/26/2017   Normal MRI brain (with and without) - had vision loss and dizziness when done   Assessment/Plan: Encounter Diagnoses  Name Primary?   Throat discomfort Yes   Globus sensation    Chronic GERD    Post-nasal drip    Environmental and seasonal allergies    Chronic nasal congestion     Assessment and Plan Assessment & Plan Chronic intermittent left oropharyngeal discomfort Intermittent sensation of swelling and discomfort in the left oropharyngeal region, possibly related to salivary gland or lymphatic issues. She deferred imaging today and flexible scope exam. We discussed that chronic nasal drainage and allergy management may help. We also discussed that this could be a sx of GERD LPR - Prescribed Flonase for nasal drainage control. - Prescribe Xyzal for allergy management. - Recommend Pepcid  for reflux management. - Suggest a reflux supplement to be taken after meals.  Chronic nasal congestion and post-nasal drainage - trial of Xyzal 5 mg mg daily and Flonase 2 puffs b/l nares BID - consider nasal saline rinses   Gastroesophageal reflux disease (GERD) GERD with heartburn symptoms, possibly contributing to oropharyngeal discomfort. - Pepcid  20 mg BID  -  Reflux Gourmet after meals - diet and lifestyle changes to minimize GERD - Refer to [employer] blog for dietary and lifestyle modifications/reflux cook book   Thank you for allowing me to participate in the care of this patient. Please do not hesitate to contact me with any questions or concerns.   Elena Larry, MD Otolaryngology [facility] Phone: [phone] Fax: [phone]    10/24/2023, 2:11 PM

## 2023-11-02 ENCOUNTER — Other Ambulatory Visit (INDEPENDENT_AMBULATORY_CARE_PROVIDER_SITE_OTHER): Payer: Self-pay | Admitting: Otolaryngology

## 2023-12-24 ENCOUNTER — Encounter

## 2024-01-21 ENCOUNTER — Other Ambulatory Visit (INDEPENDENT_AMBULATORY_CARE_PROVIDER_SITE_OTHER): Payer: Self-pay | Admitting: Otolaryngology

## 2024-01-27 ENCOUNTER — Encounter: Payer: Self-pay | Admitting: Emergency Medicine

## 2024-01-27 ENCOUNTER — Other Ambulatory Visit: Payer: Self-pay

## 2024-01-27 MED ORDER — LEVOTHYROXINE SODIUM 75 MCG PO TABS: 75.0000 ug | ORAL_TABLET | Freq: Every day | ORAL | 0 refills | Status: DC

## 2024-02-12 ENCOUNTER — Other Ambulatory Visit: Payer: Self-pay | Admitting: Emergency Medicine

## 2024-04-13 ENCOUNTER — Ambulatory Visit
